# Patient Record
Sex: Female | Born: 1950
Health system: Southern US, Community
[De-identification: ages and names within clinical notes are randomized; demographics above are authoritative.]

## PROBLEM LIST (undated history)

## (undated) DIAGNOSIS — R519 Headache, unspecified: Secondary | ICD-10-CM

## (undated) DIAGNOSIS — H269 Unspecified cataract: Secondary | ICD-10-CM

## (undated) DIAGNOSIS — I10 Essential (primary) hypertension: Secondary | ICD-10-CM

## (undated) DIAGNOSIS — R51 Headache: Secondary | ICD-10-CM

## (undated) DIAGNOSIS — H409 Unspecified glaucoma: Secondary | ICD-10-CM

## (undated) DIAGNOSIS — C50919 Malignant neoplasm of unspecified site of unspecified female breast: Secondary | ICD-10-CM

## (undated) DIAGNOSIS — T7840XA Allergy, unspecified, initial encounter: Secondary | ICD-10-CM

## (undated) DIAGNOSIS — M1990 Unspecified osteoarthritis, unspecified site: Secondary | ICD-10-CM

## (undated) DIAGNOSIS — Z862 Personal history of diseases of the blood and blood-forming organs and certain disorders involving the immune mechanism: Secondary | ICD-10-CM

## (undated) DIAGNOSIS — D649 Anemia, unspecified: Secondary | ICD-10-CM

## (undated) DIAGNOSIS — J302 Other seasonal allergic rhinitis: Secondary | ICD-10-CM

## (undated) DIAGNOSIS — K219 Gastro-esophageal reflux disease without esophagitis: Secondary | ICD-10-CM

## (undated) DIAGNOSIS — E785 Hyperlipidemia, unspecified: Secondary | ICD-10-CM

## (undated) HISTORY — DX: Other seasonal allergic rhinitis: J30.2

## (undated) HISTORY — DX: Personal history of diseases of the blood and blood-forming organs and certain disorders involving the immune mechanism: Z86.2

## (undated) HISTORY — DX: Unspecified glaucoma: H40.9

## (undated) HISTORY — DX: Hyperlipidemia, unspecified: E78.5

## (undated) HISTORY — DX: Unspecified osteoarthritis, unspecified site: M19.90

## (undated) HISTORY — DX: Gastro-esophageal reflux disease without esophagitis: K21.9

## (undated) HISTORY — DX: Essential (primary) hypertension: I10

## (undated) HISTORY — DX: Allergy, unspecified, initial encounter: T78.40XA

## (undated) HISTORY — DX: Unspecified cataract: H26.9

## (undated) HISTORY — PX: BREAST BIOPSY: SHX20

---

## 2009-06-06 ENCOUNTER — Ambulatory Visit: Payer: Self-pay | Admitting: Obstetrics and Gynecology

## 2009-06-06 ENCOUNTER — Inpatient Hospital Stay (HOSPITAL_COMMUNITY): Admission: AD | Admit: 2009-06-06 | Discharge: 2009-06-06 | Payer: Self-pay | Admitting: Obstetrics & Gynecology

## 2011-01-13 LAB — URINALYSIS, ROUTINE W REFLEX MICROSCOPIC
Glucose, UA: NEGATIVE mg/dL
Hgb urine dipstick: NEGATIVE
Protein, ur: NEGATIVE mg/dL
Specific Gravity, Urine: 1.02 (ref 1.005–1.030)

## 2011-01-13 LAB — CBC
HCT: 40.1 % (ref 36.0–46.0)
MCV: 84.7 fL (ref 78.0–100.0)
RBC: 4.73 MIL/uL (ref 3.87–5.11)
WBC: 4.5 10*3/uL (ref 4.0–10.5)

## 2016-09-20 ENCOUNTER — Encounter: Payer: Self-pay | Admitting: Family Medicine

## 2016-09-20 ENCOUNTER — Ambulatory Visit: Payer: Self-pay | Attending: Family Medicine | Admitting: Family Medicine

## 2016-09-20 ENCOUNTER — Telehealth: Payer: Self-pay | Admitting: *Deleted

## 2016-09-20 VITALS — BP 121/82 | HR 65 | Temp 97.1°F | Resp 24 | Ht 65.0 in | Wt 184.0 lb

## 2016-09-20 DIAGNOSIS — J3089 Other allergic rhinitis: Secondary | ICD-10-CM | POA: Insufficient documentation

## 2016-09-20 DIAGNOSIS — Z131 Encounter for screening for diabetes mellitus: Secondary | ICD-10-CM | POA: Insufficient documentation

## 2016-09-20 DIAGNOSIS — Z23 Encounter for immunization: Secondary | ICD-10-CM | POA: Insufficient documentation

## 2016-09-20 DIAGNOSIS — Z13220 Encounter for screening for lipoid disorders: Secondary | ICD-10-CM | POA: Insufficient documentation

## 2016-09-20 DIAGNOSIS — K219 Gastro-esophageal reflux disease without esophagitis: Secondary | ICD-10-CM | POA: Insufficient documentation

## 2016-09-20 DIAGNOSIS — I10 Essential (primary) hypertension: Secondary | ICD-10-CM | POA: Insufficient documentation

## 2016-09-20 DIAGNOSIS — Z0000 Encounter for general adult medical examination without abnormal findings: Secondary | ICD-10-CM | POA: Insufficient documentation

## 2016-09-20 DIAGNOSIS — Z8249 Family history of ischemic heart disease and other diseases of the circulatory system: Secondary | ICD-10-CM | POA: Insufficient documentation

## 2016-09-20 LAB — GLUCOSE, POCT (MANUAL RESULT ENTRY): POC Glucose: 95 mg/dl (ref 70–99)

## 2016-09-20 LAB — POCT GLYCOSYLATED HEMOGLOBIN (HGB A1C): Hemoglobin A1C: 5.7

## 2016-09-20 MED ORDER — OMEPRAZOLE 20 MG PO CPDR: 20.0000 mg | DELAYED_RELEASE_CAPSULE | Freq: Every day | ORAL | 2 refills | Status: DC

## 2016-09-20 MED ORDER — MONTELUKAST SODIUM 10 MG PO TABS: 10.0000 mg | ORAL_TABLET | Freq: Every day | ORAL | 2 refills | Status: DC

## 2016-09-20 MED ORDER — AMLODIPINE BESYLATE 5 MG PO TABS: 5.0000 mg | ORAL_TABLET | Freq: Every day | ORAL | 2 refills | Status: DC

## 2016-09-20 MED FILL — OMEPRAZOLE DR 20 MG CAPSULE: 20 | 30 days supply | Qty: 30 | Fill #0

## 2016-09-20 MED FILL — MONTELUKAST SOD 10 MG TAB: 10 | 30 days supply | Qty: 30 | Fill #0

## 2016-09-20 MED FILL — AMLODIPINE BESYLATE 5 MG TA: 5 | 30 days supply | Qty: 30 | Fill #0

## 2016-09-20 NOTE — Progress Notes (Signed)
Subjective:   Chief Complaint  Patient presents with  . Gynecologic Exam  . Hypertension  . Headache   HPI Jill Adkins 65 y.o. female presents with multiple chief complaints she is also requesting medication refills.She reports a history of hypertension, GERD, hyperlipidemia, and allergic rhinitis.She has complaints of epigastric pain 5/10  and heartburn. She reports long term use of omeprazole. She denies any difficulty swallowing. She denies any CP, SOB, or swelling of the extremities. She was encouraged to schedule another appointment for a complete physical including a gynecologic exam.   Past Medical History:  Diagnosis Date  . GERD (gastroesophageal reflux disease)   . Hyperlipidemia   . Hypertension    History reviewed. No pertinent surgical history.  Family History  Problem Relation Age of Onset  . Hypertension Mother   . Hypertension Father    Social History   Social History  . Marital status: Single    Spouse name: N/A  . Number of children: N/A  . Years of education: N/A   Social History Main Topics  . Smoking status: Never Smoker  . Smokeless tobacco: Never Used  . Alcohol use No  . Drug use: No  . Sexual activity: Not on file    No outpatient prescriptions prior to visit.   No facility-administered medications prior to visit.    No Known Allergies  Review of Systems  HENT: Negative for sore throat.   Eyes: Negative for blurred vision.  Respiratory: Negative.   Cardiovascular: Negative.   Gastrointestinal: Positive for abdominal pain and heartburn. Negative for nausea and vomiting.  Neurological: Positive for headaches.     Objective:    Physical Exam  Constitutional: She is oriented to person, place, and time. She appears well-developed and well-nourished.  HENT:  Right Ear: External ear normal.  Left Ear: External ear normal.  Nose: Nose normal.  Mouth/Throat: Oropharynx is clear and moist.  Eyes: Conjunctivae are normal. Right eye  exhibits no discharge. Left eye exhibits no discharge. No scleral icterus.  Neck: Normal range of motion. No JVD present.  Cardiovascular: Normal rate, regular rhythm, normal heart sounds and intact distal pulses.   Pulmonary/Chest: Effort normal and breath sounds normal.  Abdominal: Soft. Bowel sounds are normal. She exhibits no distension and no mass. There is tenderness (epigastric tenderness with palpation).  Lymphadenopathy:    She has no cervical adenopathy.  Neurological: She is alert and oriented to person, place, and time.  Skin: Skin is warm and dry.  Psychiatric: She has a normal mood and affect. Her behavior is normal. Thought content normal.   BP 121/82   Pulse 65   Temp 97.1 F (36.2 C) (Oral)   Resp (!) 24   Ht 5\' 5"  (1.651 m)   Wt 184 lb (83.5 kg)   SpO2 97%   BMI 30.62 kg/m  Wt Readings from Last 3 Encounters:  09/20/16 184 lb (83.5 kg)   Immunization History  Administered Date(s) Administered  . Influenza,inj,Quad PF,36+ Mos 09/20/2016  . Tdap 09/20/2016   No results found for: TSH Lab Results  Component Value Date   WBC 4.5 06/06/2009   HGB 13.1 06/06/2009   HCT 40.1 06/06/2009   MCV 84.7 06/06/2009   PLT 239 06/06/2009   Lab Results  Component Value Date   HGBA1C 5.7 09/20/2016     Assessment & Plan:   Problem List Items Addressed This Visit    None    Visit Diagnoses    Gastroesophageal reflux disease, esophagitis presence  not specified    -  Primary   Relevant Medications   omeprazole (PRILOSEC) 20 MG capsule   Essential hypertension       Relevant Medications   amLODipine (NORVASC) 5 MG tablet   Screening for diabetes mellitus       Relevant Orders   POCT glucose (manual entry)   POCT glycosylated hemoglobin (Hb A1C)   Screening for hyperlipidemia       Relevant Orders   Lipid Panel    -Pt.has not fasted today order placed for future fasting lipid panel.   Healthcare maintenance       Relevant Orders   Ambulatory referral to  Gastroenterology   MM Digital Screening        -Come back in 2 weeks for complete physical exam with pap.    Chronic non-seasonal allergic rhinitis, unspecified trigger       Relevant Medications   montelukast (SINGULAIR) 10 MG tablet   Encounter for immunization       Relevant Orders   Flu Vaccine QUAD 36+ mos IM (Completed)     I have evaluated  Ms. Depaolis's omeprazole, montelukast, and amLODipine. I am also having her start on omeprazole, amLODipine, and montelukast.  Meds ordered this encounter  Medications  . DISCONTD: omeprazole (PRILOSEC) 20 MG capsule    Sig: Take 20 mg by mouth daily.  Marland Kitchen DISCONTD: montelukast (SINGULAIR) 10 MG tablet    Sig: Take 10 mg by mouth at bedtime.  Marland Kitchen DISCONTD: amLODipine (NORVASC) 5 MG tablet    Sig: Take 5 mg by mouth daily.  Marland Kitchen omeprazole (PRILOSEC) 20 MG capsule    Sig: Take 1 capsule (20 mg total) by mouth daily.    Dispense:  30 capsule    Refill:  2    Order Specific Question:   Supervising Provider    Answer:   Tresa Garter [id number]  . amLODipine (NORVASC) 5 MG tablet    Sig: Take 1 tablet (5 mg total) by mouth daily.    Dispense:  90 tablet    Refill:  2    Order Specific Question:   Supervising Provider    Answer:   Tresa Garter [id number]  . montelukast (SINGULAIR) 10 MG tablet    Sig: Take 1 tablet (10 mg total) by mouth at bedtime.    Dispense:  30 tablet    Refill:  2    Order Specific Question:   Supervising Provider    Answer:   Tresa Garter [id number]    Fredia Beets, FNP

## 2016-09-20 NOTE — Progress Notes (Signed)
  C/o Migraine started this morning. Stomach pain. Right thigh (spot) x 1 week. Refill: amlodipine, omeprazole. Check cholesterol, hx: hyperlipidemia; also DM screening.  Last meal at 5am this morning CBG: 95mg /dl; A1c: 5.7.  Fax request for [place]. Patient advised should expect to receive call from [place]; contact information provided to patient.  Priscille Heidelberg, RN, BSN

## 2016-09-20 NOTE — Patient Instructions (Addendum)
Food Choices for Gastroesophageal Reflux Disease, Adult When you have gastroesophageal reflux disease (GERD), the foods you eat and your eating habits are very important. Choosing the right foods can help ease the discomfort of GERD. What general guidelines do I need to follow?  Choose fruits, vegetables, whole grains, low-fat dairy products, and low-fat meat, fish, and poultry.  Limit fats such as oils, salad dressings, butter, nuts, and avocado.  Keep a food diary to identify foods that cause symptoms.  Avoid foods that cause reflux. These may be different for different people.  Eat frequent small meals instead of three large meals each day.  Eat your meals slowly, in a relaxed setting.  Limit fried foods.  Cook foods using methods other than frying.  Avoid drinking alcohol.  Avoid drinking large amounts of liquids with your meals.  Avoid bending over or lying down until 2-3 hours after eating. What foods are not recommended? The following are some foods and drinks that may worsen your symptoms: Vegetables  Tomatoes. Tomato juice. Tomato and spaghetti sauce. Chili peppers. Onion and garlic. Horseradish. Fruits  Oranges, grapefruit, and lemon (fruit and juice). Meats  High-fat meats, fish, and poultry. This includes hot dogs, ribs, ham, sausage, salami, and bacon. Dairy  Whole milk and chocolate milk. Sour cream. Cream. Butter. Ice cream. Cream cheese. Beverages  Coffee and tea, with or without caffeine. Carbonated beverages or energy drinks. Condiments  Hot sauce. Barbecue sauce. Sweets/Desserts  Chocolate and cocoa. Donuts. Peppermint and spearmint. Fats and Oils  High-fat foods, including [place] fries and potato chips. Other  Vinegar. Strong spices, such as black pepper, white pepper, red pepper, cayenne, curry powder, cloves, ginger, and chili powder. The items listed above may not be a complete list of foods and beverages to avoid. Contact your dietitian for more  information.  This information is not intended to replace advice given to you by your health care provider. Make sure you discuss any questions you have with your health care provider. Document Released: 09/24/2005 Document Revised: 03/01/2016 Document Reviewed: 07/29/2013 Elsevier Interactive Patient Education  2017 Elsevier Inc.   Hypertension Hypertension is another name for high blood pressure. High blood pressure forces your heart to work harder to pump blood. A blood pressure reading has two numbers, which includes a higher number over a lower number (example: 110/72). Follow these instructions at home:  Have your blood pressure rechecked by your doctor.  Only take medicine as told by your doctor. Follow the directions carefully. The medicine does not work as well if you skip doses. Skipping doses also puts you at risk for problems.  Do not smoke.  Monitor your blood pressure at home as told by your doctor. Contact a doctor if:  You think you are having a reaction to the medicine you are taking.  You have repeat headaches or feel dizzy.  You have puffiness (swelling) in your ankles.  You have trouble with your vision. Get help right away if:  You get a very bad headache and are confused.  You feel weak, numb, or faint.  You get chest or belly (abdominal) pain.  You throw up (vomit).  You cannot breathe very well. This information is not intended to replace advice given to you by your health care provider. Make sure you discuss any questions you have with your health care provider. Document Released: 03/12/2008 Document Revised: 03/01/2016 Document Reviewed: 07/17/2013 Elsevier Interactive Patient Education  2017 [employer].

## 2016-09-20 NOTE — Telephone Encounter (Signed)
Pt authorized daughter to call to ask about lab results for HgA1C today. Explained to pt concern for prediabetes as per provider, M.Hairston,NP. Informed to exercise 3-4 times/ week for 30 minutes and eat diet low in carbohydrates, simple sugars, limit juice, sodas, and increase vegetable intake. Also informed to return in the morning for FLP, 2 weeks for physical, and 3 months for f/u HgA1C. Pt verbalized understanding. She had question about referrals: Mammogram and Endoscopy. Explained the referral department would call them with the appointments. Carilyn Goodpasture, RN

## 2016-09-21 ENCOUNTER — Ambulatory Visit: Payer: Self-pay | Attending: Family Medicine

## 2016-09-21 DIAGNOSIS — Z13220 Encounter for screening for lipoid disorders: Secondary | ICD-10-CM | POA: Insufficient documentation

## 2016-09-21 LAB — LIPID PANEL
CHOL/HDL RATIO: 3.7 ratio (ref <5.0)
Cholesterol: 244 mg/dL — ABNORMAL HIGH (ref <200)
HDL: 66 mg/dL (ref >50)
LDL CALC: 162 mg/dL — AB (ref <100)
Triglycerides: 80 mg/dL (ref <150)
VLDL: 16 mg/dL (ref <30)

## 2016-09-21 NOTE — Progress Notes (Signed)
Patient here for lab visit only 

## 2016-09-24 ENCOUNTER — Other Ambulatory Visit: Payer: Self-pay | Admitting: Family Medicine

## 2016-09-24 DIAGNOSIS — E785 Hyperlipidemia, unspecified: Secondary | ICD-10-CM | POA: Insufficient documentation

## 2016-09-24 DIAGNOSIS — E782 Mixed hyperlipidemia: Secondary | ICD-10-CM

## 2016-09-24 LAB — HM PAP SMEAR: HM Pap smear: NORMAL

## 2016-09-24 MED ORDER — ROSUVASTATIN CALCIUM 10 MG PO TABS: 10.0000 mg | ORAL_TABLET | Freq: Every day | ORAL | 2 refills | Status: DC

## 2016-09-24 MED FILL — ROSUVASTATIN CALCIUM 10 MG: 10 | 30 days supply | Qty: 30 | Fill #0

## 2016-09-25 ENCOUNTER — Encounter (INDEPENDENT_AMBULATORY_CARE_PROVIDER_SITE_OTHER): Payer: Self-pay

## 2016-09-25 ENCOUNTER — Telehealth: Payer: Self-pay | Admitting: *Deleted

## 2016-09-25 ENCOUNTER — Telehealth: Payer: Self-pay

## 2016-09-25 NOTE — Telephone Encounter (Addendum)
Call placed to [facility], translation of call provided by rep "[place], [place]".  ___  RN advised patient per FNP Mandesia Hairston: Lipid panel showed elevated cholesterol and LDL levels. Elevated cholesterol and lipids can increase your risk of cardiovascular disease. I have started you on a medication called Crestor to help reduce these levels. -Come back in 8 weeks for lipid panel lab recheck.

## 2016-09-25 NOTE — Telephone Encounter (Signed)
Per patient, she stopped take Omeprazole because it makes her sick. She c/o fever, headache and abdominal pain.  In the past, patient took Omeprazole with Magnesium request to restart this medication. Please advise.

## 2016-09-25 NOTE — Telephone Encounter (Addendum)
-   Rn advised patient per FNP Mandesia Hairston: -HgbA1c is 5.7. A Hgb of 5.7 to 6.4 is considered pre-diabetes. Start eating a diet lower in sugar, starches, white bread, and white rice. Start exercising 3 to 5 times a week for at least 30 minutes. -Come back for repeat HgbA1c lab in 3 months.

## 2016-09-25 NOTE — Telephone Encounter (Signed)
Pt and daughter came into office for clarification of lab results. Explained lab results to patient while daughter translating. Verbalized understanding. Informed of importance of diet and exercise as related to hypercholesterol and prediabetes.pt and daughter verbalized understanding. Pt and daughter requested to have referral for colonoscopy to [facility] and mammogram set up at [facility]. She stated that she spoke to personnel at [facility] and they have an appointment before February. Preferred to have it next month.

## 2016-09-26 ENCOUNTER — Other Ambulatory Visit: Payer: Self-pay | Admitting: Family Medicine

## 2016-09-26 ENCOUNTER — Telehealth: Payer: Self-pay | Admitting: *Deleted

## 2016-09-26 MED ORDER — RANITIDINE HCL 150 MG PO TABS: 150.0000 mg | ORAL_TABLET | Freq: Two times a day (BID) | ORAL | 2 refills | Status: DC

## 2016-09-26 MED FILL — raNITIdine HCL 150 MG TABS: 150 | 15 days supply | Qty: 30 | Fill #0

## 2016-09-26 NOTE — Telephone Encounter (Signed)
The use of omeprazole is not associated with fever, headaches, or abdominal pain. If she is having these symptoms she can make an appointment. Considering her history of long-term use of omeprazole I have prescribe her ranitidine 150 mg by mouth twice a day.

## 2016-09-26 NOTE — Telephone Encounter (Signed)
Pt aware that medication is at the pharmacy. Spoke to patient daughter. Verbalized understanding, will pick up medication on tomorrow. Carilyn Goodpasture, RN

## 2016-09-27 NOTE — Telephone Encounter (Signed)
Sent Referral to [facility] Ph# [phone] #[phone] .They will contact the patient to schedule an appointment  ° °

## 2016-09-27 NOTE — Telephone Encounter (Signed)
Pt daughter aware

## 2016-10-04 ENCOUNTER — Telehealth: Payer: Self-pay | Admitting: Family Medicine

## 2016-10-04 ENCOUNTER — Other Ambulatory Visit: Payer: Self-pay | Admitting: Family Medicine

## 2016-10-04 ENCOUNTER — Telehealth: Payer: Self-pay

## 2016-10-04 ENCOUNTER — Ambulatory Visit: Payer: Self-pay | Attending: Family Medicine | Admitting: Family Medicine

## 2016-10-04 VITALS — BP 149/85 | HR 74 | Temp 97.6°F | Ht 65.0 in | Wt 182.4 lb

## 2016-10-04 DIAGNOSIS — K219 Gastro-esophageal reflux disease without esophagitis: Secondary | ICD-10-CM | POA: Insufficient documentation

## 2016-10-04 DIAGNOSIS — K5900 Constipation, unspecified: Secondary | ICD-10-CM | POA: Insufficient documentation

## 2016-10-04 DIAGNOSIS — I10 Essential (primary) hypertension: Secondary | ICD-10-CM

## 2016-10-04 DIAGNOSIS — Z01419 Encounter for gynecological examination (general) (routine) without abnormal findings: Secondary | ICD-10-CM

## 2016-10-04 DIAGNOSIS — Z79899 Other long term (current) drug therapy: Secondary | ICD-10-CM | POA: Insufficient documentation

## 2016-10-04 DIAGNOSIS — J3089 Other allergic rhinitis: Secondary | ICD-10-CM

## 2016-10-04 DIAGNOSIS — Z8249 Family history of ischemic heart disease and other diseases of the circulatory system: Secondary | ICD-10-CM | POA: Insufficient documentation

## 2016-10-04 DIAGNOSIS — E785 Hyperlipidemia, unspecified: Secondary | ICD-10-CM | POA: Insufficient documentation

## 2016-10-04 DIAGNOSIS — Z0000 Encounter for general adult medical examination without abnormal findings: Secondary | ICD-10-CM | POA: Insufficient documentation

## 2016-10-04 DIAGNOSIS — E782 Mixed hyperlipidemia: Secondary | ICD-10-CM

## 2016-10-04 LAB — HEPATIC FUNCTION PANEL
ALBUMIN: 4.4 g/dL (ref 3.6–5.1)
ALK PHOS: 72 U/L (ref 33–130)
ALT: 12 U/L (ref 6–29)
AST: 18 U/L (ref 10–35)
BILIRUBIN TOTAL: 0.6 mg/dL (ref 0.2–1.2)
Bilirubin, Direct: 0.1 mg/dL (ref <=0.2)
Indirect Bilirubin: 0.5 mg/dL (ref 0.2–1.2)
TOTAL PROTEIN: 7.6 g/dL (ref 6.1–8.1)

## 2016-10-04 LAB — CBC WITH DIFFERENTIAL/PLATELET
BASOS ABS: 40 {cells}/uL (ref 0–200)
Basophils Relative: 1 %
Eosinophils Absolute: 120 cells/uL (ref 15–500)
Eosinophils Relative: 3 %
HEMATOCRIT: 42.1 % (ref 35.0–45.0)
HEMOGLOBIN: 13.9 g/dL (ref 11.7–15.5)
LYMPHS ABS: 2560 {cells}/uL (ref 850–3900)
Lymphocytes Relative: 64 %
MCH: 26.8 pg — ABNORMAL LOW (ref 27.0–33.0)
MCHC: 33 g/dL (ref 32.0–36.0)
MCV: 81.1 fL (ref 80.0–100.0)
MONO ABS: 160 {cells}/uL — AB (ref 200–950)
MPV: 10.7 fL (ref 7.5–12.5)
Monocytes Relative: 4 %
NEUTROS PCT: 28 %
Neutro Abs: 1120 cells/uL — ABNORMAL LOW (ref 1500–7800)
Platelets: 349 10*3/uL (ref 140–400)
RBC: 5.19 MIL/uL — AB (ref 3.80–5.10)
RDW: 15 % (ref 11.0–15.0)
WBC: 4 10*3/uL (ref 3.8–10.8)

## 2016-10-04 LAB — BASIC METABOLIC PANEL
BUN: 12 mg/dL (ref 7–25)
CO2: 27 mmol/L (ref 20–31)
Calcium: 9.7 mg/dL (ref 8.6–10.4)
Chloride: 105 mmol/L (ref 98–110)
Creat: 0.74 mg/dL (ref 0.50–0.99)
GLUCOSE: 105 mg/dL — AB (ref 65–99)
POTASSIUM: 4 mmol/L (ref 3.5–5.3)
SODIUM: 142 mmol/L (ref 135–146)

## 2016-10-04 LAB — TSH: TSH: 0.81 mIU/L

## 2016-10-04 MED ORDER — ROSUVASTATIN CALCIUM 10 MG PO TABS: 10.0000 mg | ORAL_TABLET | Freq: Every day | ORAL | 0 refills | Status: DC

## 2016-10-04 MED ORDER — DOCUSATE SODIUM 100 MG PO CAPS: 100.0000 mg | ORAL_CAPSULE | Freq: Two times a day (BID) | ORAL | 0 refills | Status: DC

## 2016-10-04 MED ORDER — RANITIDINE HCL 150 MG PO TABS: 150.0000 mg | ORAL_TABLET | Freq: Two times a day (BID) | ORAL | 0 refills | Status: DC

## 2016-10-04 MED ORDER — MONTELUKAST SODIUM 10 MG PO TABS: 10.0000 mg | ORAL_TABLET | Freq: Every day | ORAL | 0 refills | Status: DC

## 2016-10-04 MED ORDER — DOCUSATE SODIUM 100 MG PO CAPS: 100.0000 mg | ORAL_CAPSULE | Freq: Two times a day (BID) | ORAL | 0 refills | Status: AC

## 2016-10-04 MED ORDER — ACETAMINOPHEN 500 MG PO TABS: 1000.0000 mg | ORAL_TABLET | Freq: Three times a day (TID) | ORAL | 0 refills | Status: DC | PRN

## 2016-10-04 MED ORDER — AMLODIPINE BESYLATE 5 MG PO TABS: 5.0000 mg | ORAL_TABLET | Freq: Every day | ORAL | 0 refills | Status: DC

## 2016-10-04 NOTE — Telephone Encounter (Signed)
Called and left pt's daughter a message informing her that patient's prescriptions have been refilled for the next 3 months.

## 2016-10-04 NOTE — Telephone Encounter (Signed)
Call returned to patient daughter; Voicemail identified patient; RN advised symptom of sore tongue may require evaluation by a provider.  Can be caused by vitamin deficiency or virus.  Would not be a symptom of GERD unless patient has regurgitation of stomach acid that touches the tongue which over time can be an irritant.  Encouraged to callback to [facility] if any additional questions or concerns.  Priscille Heidelberg, RN, BSN

## 2016-10-04 NOTE — Telephone Encounter (Signed)
Patient was seen in the office today and forgot to mention to PCP that she will be leaving the country on 10[place]09[place]16 for three months and therefore need a three moth supply on all her medication. Please send it to our pharmacy.  Thank you.

## 2016-10-04 NOTE — Telephone Encounter (Signed)
Refills for 3 months supply of her medications have been sent.

## 2016-10-04 NOTE — Telephone Encounter (Signed)
Call returned to patient daughter; Voicemail identified patient; RN advised symptom of sore tongue may require evaluation by a provider.  Can be caused by vitamin deficiency or virus.  Would not be a symptom of GERD unless patient has regurgitation of stomach acid that touches the tongue which over time can be an irritant. Recommended otc: Orajel topical pain relief, or mouth rinse  Encouraged to callback to [facility] if any additional questions or concerns.  Priscille Heidelberg, RN, BSN

## 2016-10-04 NOTE — Telephone Encounter (Signed)
Pts daughter calling with concerns Daughter states that pt was seen today but was unable to communicate that pts tongue has a sore which is causing trouble to eat Daughter is requesting to speak to nurse to get advice on what pt can do/take to relieve this pain Daughter suggested the sore could be related to pts GERD

## 2016-10-04 NOTE — Progress Notes (Signed)
Pt is here today for a physical with PAP.

## 2016-10-04 NOTE — Progress Notes (Signed)
Subjective:   Patient ID: Jill Adkins, female    DOB: 1951-06-21, 65 y.o.   MRN: [id number]  Chief Complaint  Patient presents with  . Annual Exam   HPI Jill Adkins 65 y.o. female comes to office for annual physical examination and pap exam. She denies any CP, SOB, or swelling of the extremities. She denies any dizziness or lightheadedness. She does report noticing a "bump" on her tongue yesterday. She denies any difficulty eating, drinking or swallowing.She does report improvement of GERD symptoms. She denies any changes to bladder habits. She does report constipation for 1 week.   Past Medical History:  Diagnosis Date  . GERD (gastroesophageal reflux disease)   . Hyperlipidemia   . Hypertension    Family History  Problem Relation Age of Onset  . Hypertension Mother   . Hypertension Father    Social History   Social History  . Marital status: Single    Spouse name: N/A  . Number of children: N/A  . Years of education: N/A   Social History Main Topics  . Smoking status: Never Smoker  . Smokeless tobacco: Never Used  . Alcohol use No  . Drug use: No  . Sexual activity: Not on file   Outpatient Medications Prior to Visit  Medication Sig Dispense Refill  . amLODipine (NORVASC) 5 MG tablet Take 1 tablet (5 mg total) by mouth daily. 90 tablet 2  . montelukast (SINGULAIR) 10 MG tablet Take 1 tablet (10 mg total) by mouth at bedtime. 30 tablet 2  . ranitidine (ZANTAC) 150 MG tablet Take 1 tablet (150 mg total) by mouth 2 (two) times daily. 30 tablet 2  . rosuvastatin (CRESTOR) 10 MG tablet Take 1 tablet (10 mg total) by mouth daily. 30 tablet 2   No facility-administered medications prior to visit.    No Known Allergies  Review of Systems  Constitutional: Negative.   Respiratory: Negative.   Cardiovascular: Negative.   Gastrointestinal: Positive for constipation.  Genitourinary: Negative.   Musculoskeletal: Negative.   Skin: Negative.   Neurological: Negative.     Psychiatric/Behavioral: Negative.      Objective:    Physical Exam  Constitutional: She is oriented to person, place, and time. She appears well-developed and well-nourished.  HENT:  Mouth/Throat: Uvula is midline, oropharynx is clear and moist and mucous membranes are normal. No oral lesions. No lacerations. No oropharyngeal exudate or posterior oropharyngeal edema.  Enlarged tastebuds noted. No lesions. No oral candidasis present.   Eyes: Conjunctivae and EOM are normal. Pupils are equal, round, and reactive to light. Right eye exhibits no discharge. Left eye exhibits no discharge.  Neck: Normal range of motion. Neck supple. No JVD present. No thyromegaly present.  Cardiovascular: Normal rate, regular rhythm, normal heart sounds and intact distal pulses.   Pulmonary/Chest: Effort normal and breath sounds normal. No respiratory distress. She has no wheezes.  Abdominal: Soft. Bowel sounds are normal. She exhibits no distension and no mass. There is no tenderness.  Musculoskeletal: Normal range of motion.  Lymphadenopathy:    She has no cervical adenopathy.  Neurological: She is alert and oriented to person, place, and time.  Skin: Skin is warm and dry.  Psychiatric: She has a normal mood and affect. Her behavior is normal. Thought content normal.   BP (!) 149/85 (BP Location: Left Arm, Patient Position: Sitting, Cuff Size: Small)   Pulse 74   Temp 97.6 F (36.4 C) (Oral)   Ht 5\' 5"  (1.651 m)   Wt 182  lb 6.4 oz (82.7 kg)   SpO2 97%   BMI 30.35 kg/m  Wt Readings from Last 3 Encounters:  10/04/16 182 lb 6.4 oz (82.7 kg)  09/20/16 184 lb (83.5 kg)    Immunization History  Administered Date(s) Administered  . Influenza,inj,Quad PF,36+ Mos 09/20/2016  . Tdap 09/20/2016   No results found for: TSH Lab Results  Component Value Date   WBC 4.0 10/04/2016   HGB 13.9 10/04/2016   HCT 42.1 10/04/2016   MCV 81.1 10/04/2016   PLT 349 10/04/2016   Lab Results  Component Value  Date   NA 142 10/04/2016   K 4.0 10/04/2016   CO2 27 10/04/2016   GLUCOSE 105 (H) 10/04/2016   BUN 12 10/04/2016   CREATININE 0.74 10/04/2016   BILITOT 0.6 10/04/2016   ALKPHOS 72 10/04/2016   AST 18 10/04/2016   ALT 12 10/04/2016   PROT 7.6 10/04/2016   ALBUMIN 4.4 10/04/2016   CALCIUM 9.7 10/04/2016   Lab Results  Component Value Date   CHOL 244 (H) 09/21/2016   Lab Results  Component Value Date   HDL 66 09/21/2016   Lab Results  Component Value Date   LDLCALC 162 (H) 09/21/2016   Lab Results  Component Value Date   TRIG 80 09/21/2016   Lab Results  Component Value Date   CHOLHDL 3.7 09/21/2016   Lab Results  Component Value Date   HGBA1C 5.7 09/20/2016     Assessment & Plan:   Problem List Items Addressed This Visit    None    Visit Diagnoses    Annual physical exam    -  Primary   Relevant Orders   Basic metabolic panel (Completed)   CBC with Differential (Completed)   Vitamin D, 25-hydroxy (Completed)   Hepatitis C Antibody (Completed)   TSH (Completed)   HIV antibody (with reflex) (Completed)   Hepatic Function Panel (Completed)   Encounter for routine gynecological examination with Papanicolaou smear of cervix       Relevant Orders   Cervicovaginal ancillary only (Completed)     Meds ordered this encounter  Medications  . acetaminophen (TYLENOL) 500 MG tablet    Sig: Take 2 tablets (1,000 mg total) by mouth every 8 (eight) hours as needed for headache.    Dispense:  60 tablet    Refill:  0    Order Specific Question:   Supervising Provider    Answer:   Tresa Garter [id number]  .  docusate sodium (COLACE) 100 MG capsule    Sig: Take 1 capsule (100 mg total) by mouth 2 (two) times daily.    Dispense:  60 capsule    Refill:  0    Order Specific Question:   Supervising Provider    Answer:   Tresa Garter [id number]     Fredia Beets, FNP   `

## 2016-10-05 ENCOUNTER — Encounter: Payer: Self-pay | Admitting: Family Medicine

## 2016-10-05 ENCOUNTER — Ambulatory Visit: Payer: Self-pay | Attending: Internal Medicine

## 2016-10-05 LAB — CERVICOVAGINAL ANCILLARY ONLY
HPV (WINDOPATH): NOT DETECTED
WET PREP (BD AFFIRM): NEGATIVE

## 2016-10-05 LAB — HIV ANTIBODY (ROUTINE TESTING W REFLEX): HIV 1&2 Ab, 4th Generation: NONREACTIVE

## 2016-10-05 LAB — VITAMIN D 25 HYDROXY (VIT D DEFICIENCY, FRACTURES): Vit D, 25-Hydroxy: 34 ng/mL (ref 30–100)

## 2016-10-05 LAB — HEPATITIS C ANTIBODY: HCV AB: NEGATIVE

## 2016-10-08 HISTORY — PX: BREAST LUMPECTOMY: SHX2

## 2016-10-09 MED FILL — MONTELUKAST SOD 10 MG TAB: 10 | 90 days supply | Qty: 90 | Fill #0

## 2016-10-09 MED FILL — raNITIdine HCL 150 MG TABS: 150 | 90 days supply | Qty: 180 | Fill #0

## 2016-10-11 ENCOUNTER — Encounter: Payer: Self-pay | Admitting: Family Medicine

## 2016-10-11 ENCOUNTER — Other Ambulatory Visit: Payer: Self-pay | Admitting: Family Medicine

## 2016-10-11 DIAGNOSIS — N6310 Unspecified lump in the right breast, unspecified quadrant: Secondary | ICD-10-CM

## 2016-10-11 MED FILL — ROSUVASTATIN CALCIUM 10 MG: 10 | 90 days supply | Qty: 90 | Fill #0

## 2016-10-11 MED FILL — AMLODIPINE BESYLATE 5 MG TA: 5 | 90 days supply | Qty: 90 | Fill #0

## 2016-10-11 NOTE — Telephone Encounter (Signed)
Patient presented to the office for lab results from 10/04/16 visit. Patient aware of all labs being unremarkable and no changes to therapy being needed at this time. Patient expressed her understanding and had no further questions at this time.

## 2016-10-12 ENCOUNTER — Telehealth: Payer: Self-pay | Admitting: Family Medicine

## 2016-10-12 ENCOUNTER — Other Ambulatory Visit: Payer: Self-pay | Admitting: Family Medicine

## 2016-10-12 DIAGNOSIS — N6310 Unspecified lump in the right breast, unspecified quadrant: Secondary | ICD-10-CM

## 2016-10-12 DIAGNOSIS — I10 Essential (primary) hypertension: Secondary | ICD-10-CM

## 2016-10-12 NOTE — Telephone Encounter (Signed)
Patient's daughter, [name], came to the office to speak with you regarding the referral that was placed for [facility] for her mother. [name] stated that Hilda Blades from [facility] didn't receive the referral. Please follow up. Informed patient that referral coordinator will be here on Tuesday 1/9.  Thank you.

## 2016-10-16 ENCOUNTER — Encounter (HOSPITAL_COMMUNITY): Payer: Self-pay | Admitting: *Deleted

## 2016-10-16 ENCOUNTER — Ambulatory Visit (HOSPITAL_COMMUNITY)
Admission: RE | Admit: 2016-10-16 | Discharge: 2016-10-16 | Disposition: A | Discharge status: Home / Self Care | Payer: Self-pay | Source: Ambulatory Visit | Attending: Obstetrics and Gynecology | Admitting: Obstetrics and Gynecology

## 2016-10-16 VITALS — BP 120/84 | Temp 98.7°F | Ht 62.0 in | Wt 182.2 lb

## 2016-10-16 DIAGNOSIS — Z1239 Encounter for other screening for malignant neoplasm of breast: Secondary | ICD-10-CM

## 2016-10-16 DIAGNOSIS — N644 Mastodynia: Secondary | ICD-10-CM

## 2016-10-16 NOTE — Telephone Encounter (Signed)
Referral sent  (3 time ) to [facility] attention  Hassan Rowan  Thank You

## 2016-10-16 NOTE — Progress Notes (Signed)
Patient referred to [facility] by [facility] for a right breast biopsy. Screening mammogram completed 10/05/2016 recommending additional imaging of right breast. Right breast diagnostic completed 10/11/2016 showing bi-rads 4. Patient complained of right breast pain since mammogram was completed.  Pap Smear: Pap smear not completed today. Last Pap smear was 10/04/2016 at [place] and normal with negative HPV. Per patient has no history of an abnormal Pap smear. Last Pap smear result is in EPIC.  Physical exam: Breasts Breasts symmetrical. No skin abnormalities bilateral breasts. No nipple retraction bilateral breasts. No nipple discharge bilateral breasts. No lymphadenopathy. No lumps palpated bilateral breasts. Complaints of right inner and outer breast tenderness on exam. Referred patient to [facility] for a right breast biopsy per recommendation. Appointment scheduled for Wednesday, October 16, 2016 at 0830.        Pelvic/Bimanual No Pap smear completed today since last Pap smear and HPV typing was completed 10/04/2016. Pap smear not indicated per BCCCP guidelines.   Smoking History: Patient has never smoked.  Patient Navigation: Patient education provided. Access to services provided for patient through [facility] program. [place] interpreter provided.  Colorectal Cancer Screening: Per patient has never had a colonoscopy completed. No complaints today.  Used [place] interpreter Sheryle Spray Sow from [employer]. Interpreter is also patient's daughter. Waiver signed and will be scanned into EPIC.

## 2016-10-16 NOTE — Patient Instructions (Signed)
Explained breast self awarenss to Jill Adkins. Patient did not need a Pap smear today due to last Pap smear and HPV typing was 10/04/2016. Let her know [facility]CP will cover Pap smears and HPV typing every 5 years unless has a history of abnormal Pap smears. Referred patient to [facility] for a right breast biopsy per recommendation. Appointment scheduled for Wednesday, October 16, 2016 at 0830. Jill Adkins verbalized understanding.  Jill Adkins, Arvil Ch[place], RN 11:06 AM

## 2016-10-17 ENCOUNTER — Encounter: Payer: Self-pay | Admitting: Family Medicine

## 2016-10-17 ENCOUNTER — Other Ambulatory Visit: Payer: Self-pay | Admitting: Radiology

## 2016-10-17 ENCOUNTER — Encounter (HOSPITAL_COMMUNITY): Payer: Self-pay | Admitting: *Deleted

## 2016-10-29 ENCOUNTER — Other Ambulatory Visit: Payer: Self-pay | Admitting: Radiology

## 2016-10-31 ENCOUNTER — Encounter (HOSPITAL_COMMUNITY): Payer: Self-pay | Admitting: *Deleted

## 2016-10-31 ENCOUNTER — Other Ambulatory Visit: Payer: Self-pay | Admitting: Surgery

## 2016-11-01 ENCOUNTER — Other Ambulatory Visit: Payer: Self-pay | Admitting: Gastroenterology

## 2016-11-01 ENCOUNTER — Other Ambulatory Visit: Payer: Self-pay | Admitting: Surgery

## 2016-11-01 DIAGNOSIS — D0511 Intraductal carcinoma in situ of right breast: Secondary | ICD-10-CM

## 2016-11-01 MED FILL — TRILYTE WITH FLAVOR PACKETS: 420 | 1 days supply | Qty: 4000 | Fill #0

## 2016-11-02 ENCOUNTER — Encounter: Payer: Self-pay | Admitting: Radiation Oncology

## 2016-11-03 ENCOUNTER — Ambulatory Visit
Admission: RE | Admit: 2016-11-03 | Discharge: 2016-11-03 | Disposition: A | Discharge status: Home / Self Care | Payer: No Typology Code available for payment source | Source: Ambulatory Visit | Attending: Surgery | Admitting: Surgery

## 2016-11-03 DIAGNOSIS — D0511 Intraductal carcinoma in situ of right breast: Secondary | ICD-10-CM

## 2016-11-03 MED ORDER — GADOBENATE DIMEGLUMINE 529 MG/ML IV SOLN
17.0000 mL | Freq: Once | INTRAVENOUS | Status: AC | PRN
  Administered 2016-11-03: 17 mL via INTRAVENOUS

## 2016-11-05 ENCOUNTER — Ambulatory Visit
Admission: RE | Admit: 2016-11-05 | Discharge: 2016-11-05 | Disposition: A | Discharge status: Home / Self Care | Payer: No Typology Code available for payment source | Source: Ambulatory Visit | Attending: Radiation Oncology | Admitting: Radiation Oncology

## 2016-11-05 ENCOUNTER — Encounter: Payer: Self-pay | Admitting: Radiation Oncology

## 2016-11-05 ENCOUNTER — Other Ambulatory Visit: Payer: Self-pay | Admitting: Oncology

## 2016-11-05 ENCOUNTER — Telehealth: Payer: Self-pay | Admitting: Oncology

## 2016-11-05 VITALS — BP 139/71 | HR 71 | Temp 98.8°F | Resp 16 | Ht 62.0 in | Wt 179.2 lb

## 2016-11-05 DIAGNOSIS — Z8249 Family history of ischemic heart disease and other diseases of the circulatory system: Secondary | ICD-10-CM | POA: Insufficient documentation

## 2016-11-05 DIAGNOSIS — Z170 Estrogen receptor positive status [ER+]: Principal | ICD-10-CM

## 2016-11-05 DIAGNOSIS — C50311 Malignant neoplasm of lower-inner quadrant of right female breast: Secondary | ICD-10-CM

## 2016-11-05 DIAGNOSIS — C50212 Malignant neoplasm of upper-inner quadrant of left female breast: Secondary | ICD-10-CM

## 2016-11-05 DIAGNOSIS — I10 Essential (primary) hypertension: Secondary | ICD-10-CM | POA: Insufficient documentation

## 2016-11-05 DIAGNOSIS — K219 Gastro-esophageal reflux disease without esophagitis: Secondary | ICD-10-CM | POA: Insufficient documentation

## 2016-11-05 DIAGNOSIS — D0511 Intraductal carcinoma in situ of right breast: Secondary | ICD-10-CM | POA: Insufficient documentation

## 2016-11-05 DIAGNOSIS — C50411 Malignant neoplasm of upper-outer quadrant of right female breast: Secondary | ICD-10-CM

## 2016-11-05 DIAGNOSIS — E785 Hyperlipidemia, unspecified: Secondary | ICD-10-CM | POA: Insufficient documentation

## 2016-11-05 HISTORY — DX: Malignant neoplasm of unspecified site of unspecified female breast: C50.919

## 2016-11-05 NOTE — Telephone Encounter (Signed)
Error

## 2016-11-05 NOTE — Progress Notes (Addendum)
Location of Breast Cancer:  Histology per Pathology Report:Diagnosis 10/29/2016 Breast, right, needle core biopsy - DUCTAL CARCINOMA IN SITU WITH CALCIFICATIONS, INTERMEDIATE. - SEE MICROSCOPIC DESCRIPTION Diagnosis 1/10/218 Breast, right, needle core biopsy - DUCTAL CARCINOMA IN SITU WITH CALCIFICATIONS.  Diagnosis 10/17/2016 Breast, right, needle core biopsy - FIBROADENOMA. - THERE IS NO EVIDENCE OF MALIGNANCY  Receptor Status: ER(100%+), PR (95%+), Her2-neu (), Ki-()  Did patient present with symptoms (if so, please note symptoms) or was this found on screening mammography?: screening mammogram  Past/Anticipated interventions by surgeon, if any: Dr. [name], md, nothing scheduled as yet  Past/Anticipated interventions by medical oncology, if any: Chemotherapy : Dr. Jana Hakim  New appt 11/08/2016  Lymphedema issues, if any:  No  Pain issues, if any:  NO  SAFETY ISSUES: NO  Prior radiation? NO  Pacemaker/ICD?  NO  Possible current pregnancy? NO  Is the patient on methotrexate? NO  Current Complaints / other details:  G4P3,  Miscarriage, menses age 42, age 82st child age 81,speaks [place] from [place], daughter [name] interpreting today BP 139/71 (BP Location: Left Arm, Patient Position: Sitting, Cuff Size: Normal)   Pulse 71   Temp 98.8 F (37.1 C) (Oral)   Resp 16   Ht _0  (1.575 m)   Wt 179 lb 3.2 oz (81.3 kg)   BMI 32.78 kg/m   Wt Readings from Last 3 Encounters:  11/05/16 179 lb 3.2 oz (81.3 kg)  10/16/16 182 lb 3.2 oz (82.6 kg)  10/04/16 182 lb 6.4 oz (82.7 kg)      Rebecca Eaton, RN 11/05/2016,9:54 AM

## 2016-11-05 NOTE — Progress Notes (Signed)
Radiation Oncology         [phone] ________________________________  Name: Jill Adkins MRN: [id number]  Date: 11/05/2016  DOB: [birth year]  QM:[id number] Hairston, FNP  Curt Bears, MD     REFERRING PHYSICIAN: Curt Bears, MD   DIAGNOSIS: There were no encounter diagnoses.  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   HISTORY OF PRESENT ILLNESS: Jill Adkins is a 66 y.o. female who initially presented for screening mammogram on 10/05/16, which showed abnormality in the right breast. She was referred for right breast diagnostic on 10/11/16, with 3 subsequent biopsies revealing ductal carcinoma in situ with calcifications in the lower inner and lower outer quadrants of the right breast, with fibroadenoma noted at the 12 o'clock position. She met with Dr. Ninfa Linden and had an MRI of bilateral breasts on 11/03/16 revealing three lesions corresponding to the biopsy sites, with two large hematomas in the lower quadrants concsistent with recent biopsy. She also had linear enhancement of the left lower outer quadrant approximately 12 mm. No abnormal nodes were noted. The patient is accompanied by her daughter Jill Adkins, who the patient has elected to interpret on her behalf. She reports the patient is preparing to leave for [place] in 1 week, and will be gone for several months. She has plans to meet with Dr. Jana Hakim on Thursday to discuss hormone blockade. She comes today to discuss the role of radiotherapy.  PREVIOUS RADIATION THERAPY: No   PAST MEDICAL HISTORY:  Past Medical History:  Diagnosis Date  . Breast cancer ([place])    right breast  . GERD (gastroesophageal reflux disease)   . Hyperlipidemia   . Hypertension        PAST SURGICAL HISTORY:No past surgical history on file.   FAMILY HISTORY:  Family History  Problem Relation Age of Onset  . Hypertension Father      SOCIAL HISTORY:  reports that she has never smoked. She has never used  smokeless tobacco. She reports that she does not drink alcohol or use drugs. The patient is from [place] and speaks [place]. She has three children.   ALLERGIES: Patient has no known allergies.   MEDICATIONS:  Current Outpatient Prescriptions  Medication Sig Dispense Refill  . acetaminophen (TYLENOL) 500 MG tablet Take 2 tablets (1,000 mg total) by mouth every 8 (eight) hours as needed for headache. 60 tablet 0  . amLODipine (NORVASC) 5 MG tablet Take 1 tablet (5 mg total) by mouth daily. 90 tablet 0  . docusate sodium (COLACE) 100 MG capsule Take 1 capsule (100 mg total) by mouth 2 (two) times daily. (Patient taking differently: Take 100 mg by mouth daily as needed. ) 180 capsule 0  . montelukast (SINGULAIR) 10 MG tablet Take 1 tablet (10 mg total) by mouth at bedtime. 90 tablet 0  . ranitidine (ZANTAC) 150 MG tablet Take 1 tablet (150 mg total) by mouth 2 (two) times daily. 180 tablet 0  . rosuvastatin (CRESTOR) 10 MG tablet Take 1 tablet (10 mg total) by mouth daily. 90 tablet 0   No current facility-administered medications for this encounter.      REVIEW OF SYSTEMS: On review of systems, the patient reports that she is doing well overall. She denies any chest pain, shortness of breath, cough, fevers, chills, night sweats, unintended weight changes. She denies any bowel or bladder disturbances, and denies abdominal pain, nausea or vomiting. She denies any new musculoskeletal or joint aches or pains. She denies any lymphedema issues. A complete review  of systems is obtained and is otherwise negative.    PHYSICAL EXAM:  Wt Readings from Last 3 Encounters:  11/05/16 179 lb 3.2 oz (81.3 kg)  10/16/16 182 lb 3.2 oz (82.6 kg)  10/04/16 182 lb 6.4 oz (82.7 kg)   Temp Readings from Last 3 Encounters:  11/05/16 98.8 F (37.1 C) (Oral)  10/16/16 98.7 F (37.1 C) (Oral)  10/04/16 97.6 F (36.4 C) (Oral)   BP Readings from Last 3 Encounters:  11/05/16 139/71  10/16/16 120/84  10/04/16  (!) 149/85   Pulse Readings from Last 3 Encounters:  11/05/16 71  10/04/16 74  09/20/16 65   In general this is a well appearing African  female in no acute distress. She is alert and oriented x4 and appropriate throughout the examination. Cardiovascular exam reveals a regular rate and rhythm, no clicks rubs or murmurs are auscultated. Chest is clear to auscultation bilaterally. Lymphatic assessment is performed and does not reveal any adenopathy in the cervical, supraclavicular, axillary, or inguinal chains. Abdomen has active bowel sounds in all quadrants and is intact. The abdomen is soft, non tender, non distended. Lower extremities are negative for pretibial pitting edema, deep calf tenderness, cyanosis or clubbing. Examination of the breast reveals hematoma affecting lower quadrants of the breast on the right, no palpable tumor is noted per se though this is limited. No nipple discharge or bleeding is identified. Bruising consistent with prior biopsy was noted. The left breast does not have any palpable abnormalities   ECOG =0  0 - Asymptomatic (Fully active, able to carry on all predisease activities without restriction)  1 - Symptomatic but completely ambulatory (Restricted in physically strenuous activity but ambulatory and able to carry out work of a light or sedentary nature. For example, light housework, office work)  2 - Symptomatic, <50% in bed during the day (Ambulatory and capable of all self care but unable to carry out any work activities. Up and about more than 50% of waking hours)  3 - Symptomatic, >50% in bed, but not bedbound (Capable of only limited self-care, confined to bed or chair 50% or more of waking hours)  4 - Bedbound (Completely disabled. Cannot carry on any self-care. Totally confined to bed or chair)  5 - Death   Eustace Pen MM, Creech RH, Tormey DC, et al. [phone]). "Toxicity and response criteria of the [facility]". [place] Oncol. 5  (6): 649-55    LABORATORY DATA:  Lab Results  Component Value Date   WBC 4.0 10/04/2016   HGB 13.9 10/04/2016   HCT 42.1 10/04/2016   MCV 81.1 10/04/2016   PLT 349 10/04/2016   Lab Results  Component Value Date   NA 142 10/04/2016   K 4.0 10/04/2016   CL 105 10/04/2016   CO2 27 10/04/2016   Lab Results  Component Value Date   ALT 12 10/04/2016   AST 18 10/04/2016   ALKPHOS 72 10/04/2016   BILITOT 0.6 10/04/2016      RADIOGRAPHY: Mr Breast Bilateral W Wo Contrast  Result Date: 11/05/2016 CLINICAL DATA:  The patient has had 3 biopsies in the right breast. The mass at 12 o'clock is a fibroadenoma. The calcifications in the lower inner and lower outer quadrants represent DCIS. LABS:  GFR 96 EXAM: BILATERAL BREAST MRI WITH AND WITHOUT CONTRAST TECHNIQUE: Multiplanar, multisequence MR images of both breasts were obtained prior to and following the intravenous administration of 17 ml of MultiHance. THREE-DIMENSIONAL MR IMAGE RENDERING ON INDEPENDENT  WORKSTATION: Three-dimensional MR images were rendered by post-processing of the original MR data on an independent workstation. The three-dimensional MR images were interpreted, and findings are reported in the following complete MRI report for this study. Three dimensional images were evaluated at the independent Dyna[employer]ad workstation COMPARISON:  Previous exam(s). FINDINGS: Breast composition: b. Scattered fibroglandular tissue. Background parenchymal enhancement: Moderate. Right breast: There is a mass superiorly in the right breast which correlates with the patient's known fibroadenoma. There is an immediately adjacent biopsy clip. Two hematomas are seen in the inferior right breast, 1 in the inner quadrant and 1 in the outer quadrant. These represent sites of known DCIS. There is significant surrounding enhancement in the inferior half of the right breast. At least some of this enhancement is postprocedural. Left breast: There is linear  enhancement in the anterior aspect of the lower outer left breast, best appreciated on MIP imaging. The level of enhancement in this region is higher than elsewhere in the left breast. This enhancement spans approximately 12 mm with persistent kinetics, best seen on series 12, image 84. Lymph nodes: No abnormal appearing lymph nodes. Ancillary findings:  None. IMPRESSION: 1. Two moderate to large hematomas in the inferior right breast correlate with the known sites of DCIS. The biopsy clips on mammography are 6.5 cm apart. Much of the enhancement may be postprocedural, especially given the large hematomas. It would be difficult to differentiate between post biopsy enhancement and remaining DCIS. However, based on the multi centric disease, 6.5 cm apart, the patient is likely a mastectomy candidate. 2. There is linear enhancement in the anterior aspect of the lower outer left breast, best seen on MIP imaging and series 12, image 84 which demonstrates persistent enhancement kinetics. Given the appearance of this linear non mass enhancement and the fact that the patient has known DCIS on the right, this is moderately suspicious for DCIS. RECOMMENDATION: Recommend MRI guided biopsy of the linear enhancement in the lower outer left breast. BI-RADS CATEGORY  4: Suspicious. Electronically Signed   By: Dorise Bullion III M.D   On: 11/05/2016 11:00       IMPRESSION/PLAN: 1.  ER/PR positive DCIS of the right breast. We met with the patient and her daughter today and Dr. Lisbeth Renshaw discussed the natural history of breast cancer and general treatment, highlighting the role of radiotherapy in the management.  We discussed the available radiation techniques, and focused on the details of logistics and delivery.  We reviewed the anticipated acute and late sequelae associated with radiation in this setting.  She will  most likely a good candidate for 4 weeks of radiation therapy.  Because of the patient's planned trip to [place],  treatment will be postponed until after her return and subsequent lumpectomy. She will discuss estrogen blockade with Dr. Jana Hakim in the neoadjuvant setting when she sees him Thursday. Provided she proceeds with lumpectomy, as she desires, we will see her back 2-3 weeks postoperatively.  2. Left breast anomaly seen on MRI. As above, has been recommended the patient consider biopsy of this site. I have asked the patient if she would consider having this done prior to leaving the country, however she is not interested in doing this until she returns home after her trip which will be several months from now. I will communicate this is well with Dr. Jana Hakim and Dr. Ninfa Linden.  The above documentation reflects my direct findings during this shared patient visit. Please see the separate note by Dr. Lisbeth Renshaw on this date  for the remainder of the patient's plan of care.    Carola Rhine, PAC  This document serves as a record of services personally performed by Kyung Rudd, MD and Shona Simpson, PA-C. It was created on their behalf by Maryla Morrow, a trained medical scribe. The creation of this record is based on the scribe's personal observations and the provider's statements to them. This document has been checked and approved by the attending provider.

## 2016-11-06 ENCOUNTER — Telehealth: Payer: Self-pay | Admitting: Oncology

## 2016-11-06 NOTE — Telephone Encounter (Signed)
Per Dr. Jana Hakim pt can be rescheduled to see him on 1/31 at [place] to the pt's daughter and made her aware of the appt change. Agreed tot he appt date and time. Aware to arrive 30 minutes early.

## 2016-11-07 ENCOUNTER — Encounter (HOSPITAL_COMMUNITY): Admission: RE | Payer: Self-pay | Source: Ambulatory Visit

## 2016-11-07 ENCOUNTER — Ambulatory Visit (HOSPITAL_BASED_OUTPATIENT_CLINIC_OR_DEPARTMENT_OTHER): Payer: No Typology Code available for payment source | Admitting: Oncology

## 2016-11-07 ENCOUNTER — Ambulatory Visit (HOSPITAL_COMMUNITY): Admission: RE | Admit: 2016-11-07 | Payer: Self-pay | Source: Ambulatory Visit | Admitting: Gastroenterology

## 2016-11-07 ENCOUNTER — Encounter: Payer: Self-pay | Admitting: *Deleted

## 2016-11-07 ENCOUNTER — Encounter: Payer: Self-pay | Admitting: Pharmacist

## 2016-11-07 VITALS — BP 128/48 | HR 73 | Temp 98.3°F | Resp 17 | Ht 62.0 in | Wt 182.7 lb

## 2016-11-07 DIAGNOSIS — D0511 Intraductal carcinoma in situ of right breast: Secondary | ICD-10-CM

## 2016-11-07 DIAGNOSIS — Z170 Estrogen receptor positive status [ER+]: Secondary | ICD-10-CM

## 2016-11-07 DIAGNOSIS — C50311 Malignant neoplasm of lower-inner quadrant of right female breast: Secondary | ICD-10-CM

## 2016-11-07 SURGERY — COLONOSCOPY WITH PROPOFOL
Anesthesia: Monitor Anesthesia Care

## 2016-11-07 NOTE — Progress Notes (Signed)
Oral Chemotherapy Pharmacist Encounter  Dispensed samples to patient  Medication: letrozole 2.5mg  tabs Directions: take 1 tab by mouth daily Quantity dispensed: 60 tabs Days supply: 60 Manufacturer: [employer] Lot: [id number] Exp: 06/2018  Johny Drilling, PharmD, BCPS, BCOP 11/07/2016  12:14 PM [facility] [phone]

## 2016-11-07 NOTE — Progress Notes (Signed)
[place]  [phone] [phone]     ID: Jill Adkins DOB: 1950-11-11  MR#: [id number]  ID:[id number]  Patient Care Team: Alfonse Spruce, FNP as PCP - General (Family Medicine) Chauncey Cruel, MD as Consulting Physician (Oncology) [name], MD as Consulting Physician (General Surgery) Chauncey Cruel, MD OTHER MD:  CHIEF COMPLAINT: Ductal carcinoma in situ  CURRENT TREATMENT: Letrozole   BREAST CANCER HISTORY: The patient had routine screening mammography 10/05/2016 showing abnormal grouped calcifications in the right breast upper outer quadrant. On 10/11/2016 she underwent right diagnostic mammography with tomography. His found the breast density to be category B. In the right breast at the 12:00 position there was an oval asymmetry. There were also grouped calcifications in the right breast at the 7:00 middle depth. Ultrasound was performed the same day. This confirmed a 0.7 cm hypoechoic mass in the right breast upper outer quadrant. The right axilla was benign.  On 10/17/2016 the patient underwent ultrasound-guided biopsy of the 7 mm mass in the right breast and this showed ([id number]) a fibroadenoma, with no evidence of malignancy. 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  Given the multicentric disease, the patient had breast MRI 11/05/2016. There were in addition to the fibroadenoma, 2 hematomas in the inferior right breast  one in the inner and one in the outer quadrant. There was significant surrounding enhancement in the inferior half of the right breast some of which was postprocedural. The biopsy clips were 6.5 cm apart. 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   Her subsequent history is as detailed below  INTERVAL HISTORY: The patient was evaluated in the breast clinic 11/07/2016 accompanied by her daughter [name], who also served as Optometrist  REVIEW OF SYSTEMS: There were no specific symptoms leading to the original mammogram, which was routinely scheduled. The patient denies unusual headaches, visual changes, nausea, vomiting, stiff neck, dizziness, or gait imbalance. There has been no cough, phlegm production, or pleurisy, no chest pain or pressure, and no change in bowel or bladder habits. The patient denies fever, rash, bleeding, unexplained fatigue or unexplained weight loss. A detailed review of systems was otherwise entirely negative.   PAST MEDICAL HISTORY: Past Medical History:  Diagnosis Date  . Breast cancer ([place])    right breast  . GERD (gastroesophageal reflux disease)   . Hyperlipidemia   . Hypertension     PAST SURGICAL HISTORY: No past surgical history on file.  FAMILY HISTORY Family History  Problem Relation Age of Onset  . Hypertension Father   The patient's father died at the age of 7, with a history of hypertension; the patient's mother is alive at age 15. The patient had 3 brothers 2 sisters. There is no history of cancer in the family to her knowledge.  GYNECOLOGIC HISTORY:  Menarche age 3, first live birth age 37. The patient is GX P3. She does not remember when she stopped having  periods. She did not use oral contraceptives. She used birth control pills remotely with no complications. No LMP recorded. Patient is postmenopausal.   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 She describes herself is single. She is currently residing with her daughter [name] in [place]. [name] works in Oceanographer. [name] lives in [place] and is a Scientist, clinical (histocompatibility and immunogenetics) and Electrical engineer lives in [place] where she is studying. The patient has 11 grandchildren. The patient attends the local mosque  ADVANCED DIRECTIVES: Not in place   HEALTH MAINTENANCE: Social History  Substance Use Topics  . Smoking status: Never Smoker  . Smokeless tobacco: Never Used  . Alcohol use No     Colonoscopy:Never  PAP:  Bone density: Never   No Known Allergies  Current Outpatient Prescriptions  Medication Sig Dispense Refill  . acetaminophen (TYLENOL) 500 MG tablet Take 2 tablets (1,000 mg total) by mouth every 8 (eight) hours as needed for headache. 60 tablet 0  . amLODipine (NORVASC) 5 MG tablet Take 1 tablet (5 mg total) by mouth daily. 90 tablet 0  . docusate sodium (COLACE) 100 MG capsule Take 1 capsule (100 mg total) by mouth 2 (two) times daily. (Patient taking differently: Take 100 mg by mouth daily as needed. ) 180 capsule 0  . montelukast (SINGULAIR) 10 MG tablet Take 1 tablet (10 mg total) by mouth at bedtime. 90 tablet 0  . ranitidine (ZANTAC) 150 MG tablet Take 1 tablet (150 mg total) by mouth 2 (two) times daily. 180 tablet 0  . rosuvastatin (CRESTOR) 10 MG tablet Take 1 tablet (10 mg total) by mouth daily. 90 tablet 0   No current facility-administered medications for this visit.     OBJECTIVE: Middle-aged African woman in no acute distress Vitals:   11/07/16 1044  BP: (!) 128/48  Pulse: 73  Resp: 17  Temp: 98.3 F (36.8 C)     Body mass index is 33.42 kg/m.    ECOG FS:0 - Asymptomatic  Ocular: Sclerae unicteric, pupils equal, round and reactive to light Ear-nose-throat: Oropharynx clear and moist Lymphatic: No cervical or supraclavicular adenopathy Lungs no rales or rhonchi, good excursion bilaterally Heart regular rate and rhythm, no  murmur appreciated Abd soft, obese, nontender, positive bowel sounds MSK no focal spinal tenderness, no joint edema Neuro: non-focal, well-oriented, appropriate affect Breasts: The right breast is status post recent biopsy. I do not palpate a mass. The right axilla is benign. The left breast shows also no palpable masses in the left axilla is benign. There are no skin or nipple changes of concern in either breast.   LAB RESULTS:  CMP     Component Value Date/Time   NA 142 10/04/2016 1001   K 4.0 10/04/2016 1001   CL 105 10/04/2016 1001   CO2 27 10/04/2016 1001   GLUCOSE 105 (H) 10/04/2016 1001   BUN 12 10/04/2016 1001   CREATININE 0.74 10/04/2016 1001   CALCIUM 9.7 10/04/2016 1001   PROT 7.6 10/04/2016 1023   ALBUMIN 4.4 10/04/2016 1023   AST 18 10/04/2016 1023   ALT 12 10/04/2016 1023   ALKPHOS 72 10/04/2016 1023   BILITOT 0.6 10/04/2016 1023    INo results found for: SPEP, UPEP  Lab Results  Component Value Date   WBC 4.0 10/04/2016   NEUTROABS 1,120 (L) 10/04/2016   HGB 13.9 10/04/2016   HCT 42.1 10/04/2016   MCV 81.1 10/04/2016   PLT 349 10/04/2016  Chemistry      Component Value Date/Time   NA 142 10/04/2016 1001   K 4.0 10/04/2016 1001   CL 105 10/04/2016 1001   CO2 27 10/04/2016 1001   BUN 12 10/04/2016 1001   CREATININE 0.74 10/04/2016 1001      Component Value Date/Time   CALCIUM 9.7 10/04/2016 1001   ALKPHOS 72 10/04/2016 1023   AST 18 10/04/2016 1023   ALT 12 10/04/2016 1023   BILITOT 0.6 10/04/2016 1023       No results found for: LABCA2  No components found for: LABCA125  No results for input(s): INR in the last 168 hours.  Urinalysis    Component Value Date/Time   COLORURINE YELLOW 06/06/2009 1613   APPEARANCEUR CLEAR 06/06/2009 1613   LABSPEC 1.020 06/06/2009 1613   PHURINE 6.5 06/06/2009 1613   GLUCOSEU NEGATIVE 06/06/2009 1613   HGBUR NEGATIVE 06/06/2009 1613   BILIRUBINUR NEGATIVE 06/06/2009 1613   KETONESUR NEGATIVE  06/06/2009 1613   PROTEINUR NEGATIVE 06/06/2009 1613   UROBILINOGEN 0.2 06/06/2009 1613   NITRITE NEGATIVE 06/06/2009 1613   LEUKOCYTESUR  06/06/2009 1613    NEGATIVE MICROSCOPIC NOT DONE ON URINES WITH NEGATIVE PROTEIN, BLOOD, LEUKOCYTES, NITRITE, OR GLUCOSE <1000 mg/dL.     STUDIES: Mr Breast Bilateral W Wo Contrast  Result Date: 11/05/2016 CLINICAL DATA:  The patient has had 3 biopsies in the right breast. The mass at 12 o'clock is a fibroadenoma. The calcifications in the lower inner and lower outer quadrants represent DCIS. LABS:  GFR 96 EXAM: BILATERAL BREAST MRI WITH AND WITHOUT CONTRAST TECHNIQUE: Multiplanar, multisequence MR images of both breasts were obtained prior to and following the intravenous administration of 17 ml of MultiHance. THREE-DIMENSIONAL MR IMAGE RENDERING ON INDEPENDENT WORKSTATION: Three-dimensional MR images were rendered by post-processing of the original MR data on an independent workstation. The three-dimensional MR images were interpreted, and findings are reported in the following complete MRI report for this study. Three dimensional images were evaluated at the independent DynaCad workstation COMPARISON:  Previous exam(s). FINDINGS: Breast composition: b. Scattered fibroglandular tissue. Background parenchymal enhancement: Moderate. Right breast: There is a mass superiorly in the right breast which correlates with the patient's known fibroadenoma. There is an immediately adjacent biopsy clip. Two hematomas are seen in the inferior right breast, 1 in the inner quadrant and 1 in the outer quadrant. These represent sites of known DCIS. There is significant surrounding enhancement in the inferior half of the right breast. At least some of this enhancement is postprocedural. Left breast: There is linear enhancement in the anterior aspect of the lower outer left breast, best appreciated on MIP imaging. The level of enhancement in this region is higher than elsewhere in the  left breast. This enhancement spans approximately 12 mm with persistent kinetics, best seen on series 12, image 84. Lymph nodes: No abnormal appearing lymph nodes. Ancillary findings:  None. IMPRESSION: 1. Two moderate to large hematomas in the inferior right breast correlate with the known sites of DCIS. The biopsy clips on mammography are 6.5 cm apart. Much of the enhancement may be postprocedural, especially given the large hematomas. It would be difficult to differentiate between post biopsy enhancement and remaining DCIS. However, based on the multi centric disease, 6.5 cm apart, the patient is likely a mastectomy candidate. 2. There is linear enhancement in the anterior aspect of the lower outer left breast, best seen on MIP imaging and series 12, image 84 which demonstrates persistent enhancement kinetics. Given the appearance of  this linear non mass enhancement and the fact that the patient has known DCIS on the right, this is moderately suspicious for DCIS. RECOMMENDATION: Recommend MRI guided biopsy of the linear enhancement in the lower outer left breast. BI-RADS CATEGORY  4: Suspicious. Electronically Signed   By: Dorise Bullion III M.D   On: 11/05/2016 11:00    ELIGIBLE FOR AVAILABLE RESEARCH PROTOCOL:   ASSESSMENT: 67 y.o. [place] speaker originally from [place] status post right breast upper outer and lower outer quadrant biopsies 10/17/2016 and 10/29/2016, both showing ductal carcinoma in situ, grade 2, strongly estrogen and progesterone receptor positive.  (a) the patient wishes to postpone definitive surgery for travel reasons  (1) started letrozole 11/07/2016  (2) will need repeat MRI prior to definitive surgery with consideration of MRI guided biopsy of the left breast suspicious area previously noted  (3) definitive surgery pending  (4) to continue anti-estrogens for a total of 5 years  PLAN: We spent the better part of today's hour-long appointment discussing the biology of  breast cancer in general, and the specifics of the patient's tumor in particular. Marisa understands that in noninvasive ductal carcinoma, also called ductal carcinoma in situ ("DCIS") the breast cancer cells remain trapped in the ducts were they started. They cannot travel to a vital organ. For that reason these cancers in themselves are not life-threatening.  If the whole breast is removed then all the ducts are removed and since the cancer cells are trapped in the ducts, the cure rate with mastectomy for noninvasive breast cancer is approximately 99%. Nevertheless we recommend lumpectomy, because there is no survival advantage to mastectomy and because the cosmetic result is generally superior with breast conservation.  Since the patient is keeping her breasts, there will be some risk of recurrence. The recurrence can only be in the same breast since, again, the cells are trapped in the ducts. There is no connection from one breast to the other. The risk of local recurrence is cut by more than half with radiation, which is standard in this situation.  In estrogen receptor positive cancers like @FTNAME @'s, anti-estrogens can also be considered. They will further reduce the risk of recurrence by one half. In addition anti-estrogens will lower the risk of a new breast cancer developing in either breast, also by one half. That risk approaches 1% per year. Anti-estrogens reduce it to 1/2%.  Accordingly the overall plan is for surgery, followed by radiation, then a discussion of anti-estrogens. However there are 2 issues. The first is that the recent MRI of the breast showed a lesion in the left breast that we would like to have biopsied before definitive surgery. The second issue is that the patient is leaving for [place] in 2 days.  Accordingly we are going to temporize by starting the patient on anti-estrogens. We discussed the difference between tamoxifen and aromatase inhibitors in detail and the patient has  a good understanding of the possible toxicities, side effects and complications of these agents. I gave her a 5 month supply of letrozole, which should carry her through to her return to the [place] in May.  When she [place] the states we will repeat a breast MRI and if there is a persistent abnormality in the left breast we will consider MRI guided biopsy at that time.  The patient has a good understanding of the overall plan. She agrees with it. She knows the goal of treatment in her case is cure. She will call with any  problems that may develop before her next visit here.  Chauncey Cruel, MD   11/08/2016 7:04 PM Medical Oncology and Hematology [facility] [address] [place], [place] [address] Tel. [phone]    Fax. [phone]

## 2016-11-08 ENCOUNTER — Ambulatory Visit: Payer: Self-pay | Admitting: Radiation Oncology

## 2016-11-08 ENCOUNTER — Ambulatory Visit: Payer: Self-pay

## 2016-11-08 ENCOUNTER — Other Ambulatory Visit: Payer: Self-pay

## 2016-11-08 ENCOUNTER — Ambulatory Visit: Payer: Self-pay | Admitting: Oncology

## 2017-01-29 ENCOUNTER — Encounter: Payer: Self-pay | Admitting: Internal Medicine

## 2017-01-29 ENCOUNTER — Other Ambulatory Visit: Payer: Self-pay | Admitting: Oncology

## 2017-01-29 DIAGNOSIS — C50311 Malignant neoplasm of lower-inner quadrant of right female breast: Secondary | ICD-10-CM

## 2017-01-29 DIAGNOSIS — Z170 Estrogen receptor positive status [ER+]: Principal | ICD-10-CM

## 2017-02-07 ENCOUNTER — Other Ambulatory Visit: Payer: Self-pay | Admitting: Surgery

## 2017-02-07 DIAGNOSIS — N63 Unspecified lump in unspecified breast: Secondary | ICD-10-CM

## 2017-02-18 ENCOUNTER — Ambulatory Visit
Admission: RE | Admit: 2017-02-18 | Discharge: 2017-02-18 | Disposition: A | Discharge status: Home / Self Care | Payer: No Typology Code available for payment source | Source: Ambulatory Visit | Attending: Surgery | Admitting: Surgery

## 2017-02-18 DIAGNOSIS — N63 Unspecified lump in unspecified breast: Secondary | ICD-10-CM

## 2017-02-18 MED ORDER — GADOBENATE DIMEGLUMINE 529 MG/ML IV SOLN
16.0000 mL | Freq: Once | INTRAVENOUS | Status: AC | PRN
  Administered 2017-02-18: 16 mL via INTRAVENOUS

## 2017-02-20 MED FILL — AMLODIPINE BESYLATE 5 MG TA: 5 | 30 days supply | Qty: 30 | Fill #1

## 2017-02-21 ENCOUNTER — Other Ambulatory Visit: Payer: Self-pay | Admitting: *Deleted

## 2017-02-21 ENCOUNTER — Telehealth: Payer: Self-pay

## 2017-02-21 NOTE — Telephone Encounter (Signed)
This RN reviewed chart and noted MRI order with requested date for later this month.  Referral status states " incomplete ".  This RN sent message to [facility] per above so MRI can be obtained for further treatment decisions.  This RN returned call to pt to inform her of current status and plan- obtained VM - request left to return call to this RN.

## 2017-02-21 NOTE — Telephone Encounter (Signed)
The pt is returned from [place] and has had MRI and bx on L breast. She is asking what is the next step. Her appt is for 03/25/17 and she is wanting to be seen sooner b/c she is back from [place].  She is asking what about the right breast.

## 2017-02-22 ENCOUNTER — Other Ambulatory Visit: Payer: Self-pay | Admitting: *Deleted

## 2017-02-26 ENCOUNTER — Ambulatory Visit: Payer: No Typology Code available for payment source | Admitting: Family Medicine

## 2017-02-27 ENCOUNTER — Telehealth: Payer: Self-pay | Admitting: *Deleted

## 2017-02-27 NOTE — Telephone Encounter (Signed)
This RN received call from pt's daughter - [name] - who is inquiring about date for MRI so pt can be seen by MD for further treatment concerns.  See other notes per need to schedule MRI.

## 2017-03-06 ENCOUNTER — Ambulatory Visit: Payer: No Typology Code available for payment source | Admitting: Family Medicine

## 2017-03-06 ENCOUNTER — Ambulatory Visit (HOSPITAL_COMMUNITY)
Admission: RE | Admit: 2017-03-06 | Discharge: 2017-03-06 | Disposition: A | Discharge status: Home / Self Care | Payer: Self-pay | Source: Ambulatory Visit | Attending: Oncology | Admitting: Oncology

## 2017-03-06 DIAGNOSIS — C50311 Malignant neoplasm of lower-inner quadrant of right female breast: Secondary | ICD-10-CM | POA: Insufficient documentation

## 2017-03-06 DIAGNOSIS — Z170 Estrogen receptor positive status [ER+]: Secondary | ICD-10-CM | POA: Insufficient documentation

## 2017-03-06 LAB — POCT I-STAT CREATININE: CREATININE: 0.7 mg/dL (ref 0.44–1.00)

## 2017-03-06 MED ORDER — GADOBENATE DIMEGLUMINE 529 MG/ML IV SOLN
15.0000 mL | Freq: Once | INTRAVENOUS | Status: AC | PRN
  Administered 2017-03-06: 14 mL via INTRAVENOUS

## 2017-03-07 ENCOUNTER — Ambulatory Visit: Payer: Self-pay | Attending: Family Medicine | Admitting: Family Medicine

## 2017-03-07 ENCOUNTER — Telehealth: Payer: Self-pay | Admitting: Family Medicine

## 2017-03-07 ENCOUNTER — Other Ambulatory Visit: Payer: Self-pay | Admitting: Family Medicine

## 2017-03-07 VITALS — BP 111/73 | HR 67 | Temp 98.1°F | Resp 18 | Ht 61.0 in | Wt 174.8 lb

## 2017-03-07 DIAGNOSIS — Z760 Encounter for issue of repeat prescription: Secondary | ICD-10-CM

## 2017-03-07 DIAGNOSIS — I10 Essential (primary) hypertension: Secondary | ICD-10-CM | POA: Insufficient documentation

## 2017-03-07 DIAGNOSIS — R202 Paresthesia of skin: Secondary | ICD-10-CM

## 2017-03-07 DIAGNOSIS — K0889 Other specified disorders of teeth and supporting structures: Secondary | ICD-10-CM

## 2017-03-07 DIAGNOSIS — R51 Headache: Secondary | ICD-10-CM | POA: Insufficient documentation

## 2017-03-07 DIAGNOSIS — E782 Mixed hyperlipidemia: Secondary | ICD-10-CM

## 2017-03-07 DIAGNOSIS — R200 Anesthesia of skin: Secondary | ICD-10-CM

## 2017-03-07 DIAGNOSIS — R519 Headache, unspecified: Secondary | ICD-10-CM

## 2017-03-07 DIAGNOSIS — H547 Unspecified visual loss: Secondary | ICD-10-CM

## 2017-03-07 DIAGNOSIS — K219 Gastro-esophageal reflux disease without esophagitis: Secondary | ICD-10-CM | POA: Insufficient documentation

## 2017-03-07 DIAGNOSIS — E785 Hyperlipidemia, unspecified: Secondary | ICD-10-CM | POA: Insufficient documentation

## 2017-03-07 LAB — POCT UA - MICROALBUMIN
CREATININE, POC: 50 mg/dL
Microalbumin Ur, POC: 30 mg/L

## 2017-03-07 MED ORDER — ACETAMINOPHEN 500 MG PO TABS: 1000.0000 mg | ORAL_TABLET | Freq: Four times a day (QID) | ORAL | 0 refills | Status: DC | PRN

## 2017-03-07 MED ORDER — RANITIDINE HCL 150 MG PO TABS: 150.0000 mg | ORAL_TABLET | Freq: Two times a day (BID) | ORAL | 1 refills | Status: DC | PRN

## 2017-03-07 MED ORDER — AMLODIPINE BESYLATE 5 MG PO TABS: 5.0000 mg | ORAL_TABLET | Freq: Every day | ORAL | 0 refills | Status: DC

## 2017-03-07 MED ORDER — MONTELUKAST SODIUM 10 MG PO TABS: 10.0000 mg | ORAL_TABLET | Freq: Every day | ORAL | 0 refills | Status: DC

## 2017-03-07 NOTE — Telephone Encounter (Signed)
Dental referral order placed.

## 2017-03-07 NOTE — Progress Notes (Signed)
Patient is here for f/up  Patient has not taking her medication for today   Patient has not eaten for today   Patient complains about both ears itchy, tooth pain & her eyes being watery

## 2017-03-07 NOTE — Patient Instructions (Signed)

## 2017-03-07 NOTE — Telephone Encounter (Signed)
Patient was in the office today and forgot to mention to her provider that she needs a dental referral. Pt stated that she is in a lot of pain and needs tooth extracted. Informed patient that there's a waiting period for dental referrals.   Thank you.

## 2017-03-07 NOTE — Progress Notes (Signed)
Subjective:  Patient ID: Jill Adkins, female    DOB: 1951/06/09  Age: 66 y.o. MRN: [id number]  CC: Hypertension  HPI Jill Adkins presents for   Jill Adkins 66 y.o. female presents with for follow up of HTN, GERD,and HLD. She also has complaints of headaches. Recent diagnosis of breast cancer diagnosed January 2018 .Marland KitchenShe repots previous symptoms of epigastric pain and heartburn controlled don current medication. She denies nay abdominal pain, dysphagia, hematochezia, melena, and weight loss. She denies any CP, SOB, or swelling of the extremities.  Headache: Patient presents with headache complaint. Symptoms began about 4 months ago. She reports crawling sensations. Associated symptoms include dizziness. She denies any ear pain or tinnitus. Denies taking anything for symptoms. Symptoms relieved by nothing. Denies any nausea or vomiting or difficulty keep her balance. She does reports decreased visual acuity. History of corrective lens use. She does report history of new oncology medication use 4 months ago.    Outpatient Medications Prior to Visit  Medication Sig Dispense Refill  . rosuvastatin (CRESTOR) 10 MG tablet Take 1 tablet (10 mg total) by mouth daily. 90 tablet 0  . acetaminophen (TYLENOL) 500 MG tablet Take 2 tablets (1,000 mg total) by mouth every 8 (eight) hours as needed for headache. 60 tablet 0  . amLODipine (NORVASC) 5 MG tablet Take 1 tablet (5 mg total) by mouth daily. 90 tablet 0  . montelukast (SINGULAIR) 10 MG tablet Take 1 tablet (10 mg total) by mouth at bedtime. 90 tablet 0  . ranitidine (ZANTAC) 150 MG tablet Take 1 tablet (150 mg total) by mouth 2 (two) times daily. 180 tablet 0   No facility-administered medications prior to visit.     ROS Review of Systems  Constitutional: Negative.   Eyes: Positive for visual disturbance.  Respiratory: Negative.   Cardiovascular: Negative.   Gastrointestinal: Negative.   Neurological: Positive for dizziness and headaches.     Parathesias    Objective:  BP 111/73   Pulse 67   Temp 98.1 F (36.7 C) (Oral)   Resp 18   Ht 5\' 1"  (1.549 m)   Wt 174 lb 12.8 oz (79.3 kg)   SpO2 97%   BMI 33.03 kg/m   BP/Weight 03/07/2017 11/07/2016 2/29/7989  Systolic BP 211 941 740  Diastolic BP 73 48 71  Wt. (Lbs) 174.8 182.7 179.2  BMI 33.03 33.42 32.78     Physical Exam  Constitutional: She is oriented to person, place, and time. She appears well-developed and well-nourished.  HENT:  Head: Normocephalic and atraumatic.  Right Ear: External ear normal.  Left Ear: External ear normal.  Nose: Nose normal.  Mouth/Throat: Oropharynx is clear and moist.  Eyes: Conjunctivae are normal. Pupils are equal, round, and reactive to light.  Neck: No JVD present.  Cardiovascular: Normal rate, regular rhythm, normal heart sounds and intact distal pulses.   Pulmonary/Chest: Effort normal and breath sounds normal.  Abdominal: Soft. Bowel sounds are normal. There is no tenderness.  Neurological: She is alert and oriented to person, place, and time. She has normal strength and normal reflexes. She displays a negative Romberg sign.  Skin: Skin is warm and dry.  Nursing note and vitals reviewed.   Assessment & Plan:   Problem List Items Addressed This Visit      Other   Hyperlipidemia   Relevant Medications   amLODipine (NORVASC) 5 MG tablet   Other Relevant Orders   Lipid Panel (Completed)    Other Visit Diagnoses  Chronic nonintractable headache, unspecified headache type    -  Primary   Relevant Medications   amLODipine (NORVASC) 5 MG tablet   acetaminophen (TYLENOL) 500 MG tablet   Other Relevant Orders   CT Head Wo Contrast   Numbness and tingling       Relevant Orders   CT Head Wo Contrast   Chronic GERD       Relevant Medications   ranitidine (ZANTAC) 150 MG tablet   Essential hypertension       Relevant Medications   amLODipine (NORVASC) 5 MG tablet   Other Relevant Orders   Lipid Panel (Completed)    POCT UA - Microalbumin (Completed)   Decreased visual acuity       -Visual acuity screen.   Relevant Orders   Ambulatory referral to Ophthalmology   Medication refill       Relevant Medications   montelukast (SINGULAIR) 10 MG tablet      Meds ordered this encounter  Medications  . amLODipine (NORVASC) 5 MG tablet    Sig: Take 1 tablet (5 mg total) by mouth daily.    Dispense:  90 tablet    Refill:  0  . ranitidine (ZANTAC) 150 MG tablet    Sig: Take 1 tablet (150 mg total) by mouth 2 (two) times daily as needed for heartburn.    Dispense:  90 tablet    Refill:  1  . montelukast (SINGULAIR) 10 MG tablet    Sig: Take 1 tablet (10 mg total) by mouth at bedtime.    Dispense:  90 tablet    Refill:  0  . acetaminophen (TYLENOL) 500 MG tablet    Sig: Take 2 tablets (1,000 mg total) by mouth every 6 (six) hours as needed for headache.    Dispense:  60 tablet    Refill:  0    Follow-up: Return in about 3 months (around 06/07/2017), or if symptoms worsen or fail to improve.   Alfonse Spruce FNP

## 2017-03-08 LAB — LIPID PANEL
Chol/HDL Ratio: 3.5 ratio (ref 0.0–4.4)
Cholesterol, Total: 237 mg/dL — ABNORMAL HIGH (ref 100–199)
HDL: 67 mg/dL (ref >39)
LDL Calculated: 152 mg/dL — ABNORMAL HIGH (ref 0–99)
Triglycerides: 89 mg/dL (ref 0–149)
VLDL Cholesterol Cal: 18 mg/dL (ref 5–40)

## 2017-03-11 ENCOUNTER — Ambulatory Visit (HOSPITAL_COMMUNITY)
Admission: RE | Admit: 2017-03-11 | Discharge: 2017-03-11 | Disposition: A | Discharge status: Home / Self Care | Payer: Self-pay | Source: Ambulatory Visit | Attending: Family Medicine | Admitting: Family Medicine

## 2017-03-11 ENCOUNTER — Other Ambulatory Visit: Payer: Self-pay | Admitting: Family Medicine

## 2017-03-11 DIAGNOSIS — G8929 Other chronic pain: Secondary | ICD-10-CM | POA: Insufficient documentation

## 2017-03-11 DIAGNOSIS — R202 Paresthesia of skin: Secondary | ICD-10-CM | POA: Insufficient documentation

## 2017-03-11 DIAGNOSIS — R200 Anesthesia of skin: Secondary | ICD-10-CM | POA: Insufficient documentation

## 2017-03-11 DIAGNOSIS — R51 Headache: Secondary | ICD-10-CM | POA: Insufficient documentation

## 2017-03-11 DIAGNOSIS — E782 Mixed hyperlipidemia: Secondary | ICD-10-CM

## 2017-03-11 MED ORDER — ROSUVASTATIN CALCIUM 40 MG PO TABS: 40.0000 mg | ORAL_TABLET | Freq: Every day | ORAL | 0 refills | Status: DC

## 2017-03-11 MED FILL — ROSUVASTATIN CALCIUM 40 MG: 40 | 30 days supply | Qty: 30 | Fill #0

## 2017-03-12 ENCOUNTER — Telehealth: Payer: Self-pay | Admitting: *Deleted

## 2017-03-12 NOTE — Telephone Encounter (Signed)
CMA call regarding lab results   Patient daughter  Verify DOB  Patient daughter was aware and understood

## 2017-03-12 NOTE — Telephone Encounter (Signed)
-----   Message from Alfonse Spruce, Eva sent at 03/11/2017 12:49 PM EDT ----- -Lipid levels were elevated. This can increase your risk of heart disease. Your dose of rosuvastatin will be increased.  -Recommend follow up in 3 months.

## 2017-03-12 NOTE — Telephone Encounter (Signed)
Per MD review of MRI of breast for follow up per use of letrozole for neoadjuvant therapy while pt was in [place] - recommendation is for the patient to proceed to surgical consult at this time.  Above discussed with pt's daughter - [name]- who verbalized understanding.  This RN contacted [facility]/Dr Blackmon's office to request appointment and was requested to fax information for scheduling.  Data faxed to [phone] attention Gabriel Cirri

## 2017-03-15 ENCOUNTER — Telehealth: Payer: Self-pay

## 2017-03-15 NOTE — Telephone Encounter (Signed)
-----   Message from Alfonse Spruce, [place] sent at 03/13/2017  2:18 PM EDT ----- No abnormality. No masses or lesions, blockages, bleeding, or swelling present. Old fracture to the bone surrounding the left eyeball noted.  If symptoms persist recommend referral to neurology.

## 2017-03-15 NOTE — Telephone Encounter (Signed)
CMA call regarding lab results   Patient did not answer but left a Vm stating the reason of the call & to call back  

## 2017-03-15 NOTE — Telephone Encounter (Signed)
Pt. Returned nurse call. Please f/u with pt.  °

## 2017-03-18 NOTE — Telephone Encounter (Signed)
Patient daughter  return CMA call regarding lab results  Patient daughter was aware and understood

## 2017-03-19 ENCOUNTER — Ambulatory Visit (INDEPENDENT_AMBULATORY_CARE_PROVIDER_SITE_OTHER): Payer: Self-pay | Admitting: Physician Assistant

## 2017-03-19 ENCOUNTER — Encounter: Payer: Self-pay | Admitting: Physician Assistant

## 2017-03-19 ENCOUNTER — Ambulatory Visit: Payer: Self-pay | Admitting: *Deleted

## 2017-03-19 ENCOUNTER — Other Ambulatory Visit: Payer: Self-pay | Admitting: Surgery

## 2017-03-19 VITALS — BP 122/82 | HR 62 | Ht 61.0 in | Wt 172.0 lb

## 2017-03-19 VITALS — Ht 61.0 in | Wt 173.8 lb

## 2017-03-19 DIAGNOSIS — Z800 Family history of malignant neoplasm of digestive organs: Secondary | ICD-10-CM

## 2017-03-19 DIAGNOSIS — K219 Gastro-esophageal reflux disease without esophagitis: Secondary | ICD-10-CM

## 2017-03-19 DIAGNOSIS — D0511 Intraductal carcinoma in situ of right breast: Secondary | ICD-10-CM

## 2017-03-19 DIAGNOSIS — Z1211 Encounter for screening for malignant neoplasm of colon: Secondary | ICD-10-CM

## 2017-03-19 DIAGNOSIS — K5900 Constipation, unspecified: Secondary | ICD-10-CM

## 2017-03-19 MED ORDER — POLYETHYLENE GLYCOL 3350 17 GM/SCOOP PO POWD: ORAL | 3 refills | Status: DC

## 2017-03-19 MED ORDER — FAMOTIDINE 40 MG PO TABS: 40.0000 mg | ORAL_TABLET | Freq: Two times a day (BID) | ORAL | 11 refills | Status: DC

## 2017-03-19 MED ORDER — NA SULFATE-K SULFATE-MG SULF 17.5-3.13-1.6 GM/177ML PO SOLN: 1.0000 | Freq: Once | ORAL | 0 refills | Status: AC

## 2017-03-19 MED FILL — POLYETHYLENE GLYCOL 3350: 15 days supply | Qty: 255 | Fill #0

## 2017-03-19 MED FILL — ?FAMOTIDINE 40 MG TABLETS: 40 MG | 30 days supply | Qty: 60 | Fill #0

## 2017-03-19 MED FILL — AMLODIPINE BESYLATE 5 MG TA: 5 | 30 days supply | Qty: 30 | Fill #0

## 2017-03-19 NOTE — Patient Instructions (Signed)
We have sent the following medications to your pharmacy for you to pick up at your convenience: [place]  1. Pepcid 40 mg  You may try Miralax, 17 grams in 8 oz of water  You have been scheduled for a colonoscopy. Please follow written instructions given to you at your visit today.  We  Have given you the sample colonoscopy prep. If you use inhalers (even only as needed), please bring them with you on the day of your procedure. Your physician has requested that you go to www.startemmi.com and enter the access code given to you at your visit today. This web site gives a general overview about your procedure. However, you should still follow specific instructions given to you by our office regarding your preparation for the procedure.

## 2017-03-19 NOTE — Progress Notes (Addendum)
Subjective:    Patient ID: Jill Adkins, female    DOB: Mar 03, 1951, 66 y.o.   MRN: [id number]  HPI Jill Adkins  is a pleasant 66 year old [place] female, new to GI today referred by Jill Arvin NP initially for screening colonoscopy. She had a nurse visit for scheduling and then added on for an office visit due to other GI complaints. Patient has not had any higher colonoscopy. She does complain of constipation long-term and usually manages with a laxative tea. She has not noticed any rectal bleeding. She does have family history of colon cancer in her brother. Patient also has complaint of chronic GERD. She says she had been on medication for 5-10 years. She had taken omeprazole fairly long-term but when off of that a while back because she wasn't sure about long-term side effects. She has been using Zantac in the antrum which wasn't helping much. She currently having frequent symptoms several times per week. Symptoms seem to be exacerbated on an empty stomach or with larger meals and she is bothered more in the evenings. She has no complaints of dysphagia or odynophagia. No prior EGD. She also mentions that she has just started back on Crestor which she had taken in the past and developed a sore tongue into feeling of swelling in her tongue. She is having no same symptoms again over the past couple of days. Other medical problems include hypertension, history of breast cancer, hyperlipidemia.  Review of Systems Pertinent positive and negative review of systems were noted in the above HPI section.  All other review of systems was otherwise negative.  Outpatient Encounter Prescriptions as of 03/19/2017  Medication Sig  . acetaminophen (TYLENOL) 500 MG tablet Take 2 tablets (1,000 mg total) by mouth every 6 (six) hours as needed for headache.  Marland Kitchen amLODipine (NORVASC) 5 MG tablet Take 1 tablet (5 mg total) by mouth daily.  . diphenhydrAMINE (BENADRYL) 25 MG tablet Take 25 mg by mouth every 6 (six) hours  as needed.  Marland Kitchen letrozole (FEMARA) 2.5 MG tablet Take 2.5 mg by mouth daily.  . Na Sulfate-K Sulfate-Mg Sulf 17.5-3.13-1.6 GM/180ML SOLN Take 1 kit by mouth once. suprep as directed. No substitutions.  Marland Kitchen omeprazole (PRILOSEC) 40 MG capsule Take 40 mg by mouth daily.  . rosuvastatin (CRESTOR) 40 MG tablet Take 1 tablet (40 mg total) by mouth daily.  . famotidine (PEPCID) 40 MG tablet Take 1 tablet (40 mg total) by mouth 2 (two) times daily.  . polyethylene glycol powder (GLYCOLAX/MIRALAX) powder Take 1 dose, 17 grams in 8 oz of water as needed for constipation.  . [DISCONTINUED] ranitidine (ZANTAC) 150 MG tablet Take 1 tablet (150 mg total) by mouth 2 (two) times daily as needed for heartburn. (Patient not taking: Reported on 03/19/2017)   No facility-administered encounter medications on file as of 03/19/2017.    No Known Allergies Patient Active Problem List   Diagnosis Date Noted  . Ductal carcinoma in situ (DCIS) of right breast 11/07/2016  . 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 Hyperlipidemia 09/24/2016   Social History   Social History  . Marital status: Widowed    Spouse name: N/A  . Number of children: N/A  . Years of education: N/A   Occupational History  . Not on file.   Social History Main Topics  . Smoking status: Never Smoker  . Smokeless tobacco: Never Used  . Alcohol use No  . Drug use: No  .  Sexual activity: No   Other Topics Concern  . Not on file   Social History Narrative  . No narrative on file    [name]'s family history includes Colon cancer (age of onset: 66) in her brother; Hypertension in her father.      Objective:    Vitals:   03/19/17 1333  BP: 122/82  Pulse: 62    Physical Exam  well-developed well-developed [place] female in no acute distress, accompanied by a family member who does interpretation. Blood pressure 122/82 pulse 62, BMI 32.5. HEENT; nontraumatic  normocephalic EOMI PERRLA sclera anicteric, Cardiovascular ;regular rate and rhythm with S1-S2 no murmur rub or gallop, Pulmonary ;clear bilaterally, Abdomen, nontender nondistended bowel sounds are active there is no palpable mass or hepatosplenomegaly , Rectal ;exam not done, Extremities; no clubbing cyanosis or edema skin warm and dry, Neuropsych ;mood and affect appropriate       Assessment & Plan:   #60 66 year old non-English-speaking [place] female referred for screening colonoscopy, asymptomatic very Patient does have family history of colon cancer in her brother #2 is no history of breast cancer #3 hypertension #4 hyperlipidemia #5 chronic GERD-on long-term PPI therapy, until recently. Off PPI therapy symptoms have recurred and are not well controlled with Zantac #6 tongue soreness and swelling on Crestor  Plan; patient will be scheduled for colonoscopy and EGD with Dr. Hilarie Adkins. Both procedures discussed in detail with patient including risks and benefits and she is agreeable to proceed Patient has to stop the Crestor and talk with her primary care provider regarding alternative medicine as it sounds as if she has been having adverse medication reaction. Discussed antireflux regimen. We'll give her a trial of Pepcid 40 mg by mouth twice a day-prescription sent. If this is ineffective may need to go back to low-dose PPI.  Jill Adkins S Jill Ewen PA-C 03/19/2017   Cc: Jill Adkins, F*  Addendum: Reviewed and agree with initial management. Jill Adkins, Jill Lines, MD

## 2017-03-19 NOTE — Progress Notes (Signed)
Denies allergies to eggs or soy products. Denies complications with sedation or anesthesia. Denies O2 use. Denies use of diet or weight loss medications.  Emmi instructions given for colonoscopy.  [name], daughter interpreter for majority of appointment as interpreter did not arrive until 1048 am. Interpreter Amina.

## 2017-03-21 ENCOUNTER — Ambulatory Visit (AMBULATORY_SURGERY_CENTER): Payer: Self-pay | Admitting: Internal Medicine

## 2017-03-21 ENCOUNTER — Encounter: Payer: Self-pay | Admitting: Internal Medicine

## 2017-03-21 VITALS — BP 134/70 | HR 59 | Temp 97.8°F | Resp 15 | Ht 61.0 in | Wt 172.0 lb

## 2017-03-21 DIAGNOSIS — Z800 Family history of malignant neoplasm of digestive organs: Secondary | ICD-10-CM

## 2017-03-21 DIAGNOSIS — K219 Gastro-esophageal reflux disease without esophagitis: Secondary | ICD-10-CM

## 2017-03-21 DIAGNOSIS — K2950 Unspecified chronic gastritis without bleeding: Secondary | ICD-10-CM

## 2017-03-21 MED ORDER — SODIUM CHLORIDE 0.9 % IV SOLN
500.0000 mL | INTRAVENOUS | Status: DC
Start: 2017-03-21 — End: 2024-07-14

## 2017-03-21 NOTE — Op Note (Signed)
[facility] Patient Name: Jill Adkins Procedure Date: 03/21/2017 4:04 PM MRN: [id number] Endoscopist: Jerene Bears , MD Age: 66 Referring MD:  Date of Birth: Aug 29, 1951 Gender: Female Account #: [id number] Procedure:                Upper GI endoscopy Indications:              Gastro-esophageal reflux disease Medicines:                Monitored Anesthesia Care Procedure:                Pre-Anesthesia Assessment:                           - Prior to the procedure, a History and Physical                            was performed, and patient medications and                            allergies were reviewed. The patient's tolerance of                            previous anesthesia was also reviewed. The risks                            and benefits of the procedure and the sedation                            options and risks were discussed with the patient.                            All questions were answered, and informed consent                            was obtained. Prior Anticoagulants: The patient has                            taken no previous anticoagulant or antiplatelet                            agents. ASA Grade Assessment: II - A patient with                            mild systemic disease. After reviewing the risks                            and benefits, the patient was deemed in                            satisfactory condition to undergo the procedure.                           After obtaining informed consent, the endoscope was  passed under direct vision. Throughout the                            procedure, the patient's blood pressure, pulse, and                            oxygen saturations were monitored continuously. The                            Model GIF-HQ190 [id number]) scope was introduced                            through the mouth, and advanced to the second part                            of duodenum. The upper GI  endoscopy was                            accomplished without difficulty. The patient                            tolerated the procedure well. Scope In: Scope Out: Findings:                 The examined esophagus was normal.                           Diffuse moderate inflammation characterized by                            erythema and friability was found at the incisura                            and in the gastric antrum. Multiple biopsies were                            obtained with cold forceps for histology and                            Helicobacter pylori testing at the incisura, on the                            greater curvature of the gastric antrum and on the                            lesser curvature of the gastric antrum.                           The cardia, gastric fundus and gastric body were                            normal. Multiple biopsies were obtained with cold  forceps for histology and Helicobacter pylori                            testing in the gastric body.                           The examined duodenum was normal. Complications:            No immediate complications. Estimated Blood Loss:     Estimated blood loss was minimal. Impression:               - Normal cardia, gastric fundus and gastric body.                            Multiple biopsies.                           - Normal esophagus.                           - Atrophic gastritis. Multiple biopsies.                           - Normal examined duodenum. Recommendation:           - Patient has a contact number available for                            emergencies. The signs and symptoms of potential                            delayed complications were discussed with the                            patient. Return to normal activities tomorrow.                            Written discharge instructions were provided to the                            patient.                            - Resume previous diet.                           - Continue present medications.                           - Await pathology results. Jerene Bears, MD 03/21/2017 4:35:34 PM This report has been signed electronically.

## 2017-03-21 NOTE — Op Note (Signed)
[facility] Patient Name: Margaretann Abate Procedure Date: 03/21/2017 4:03 PM MRN: [id number] Endoscopist: Jerene Bears , MD Age: 66 Referring MD:  Date of Birth: 1951/06/13 Gender: Female Account #: [id number] Procedure:                Colonoscopy Indications:              Screening in patient at increased risk: Family                            history of 1st-degree relative with colorectal                            cancer, This is the patient's first colonoscopy Medicines:                Monitored Anesthesia Care Procedure:                Pre-Anesthesia Assessment:                           - Prior to the procedure, a History and Physical                            was performed, and patient medications and                            allergies were reviewed. The patient's tolerance of                            previous anesthesia was also reviewed. The risks                            and benefits of the procedure and the sedation                            options and risks were discussed with the patient.                            All questions were answered, and informed consent                            was obtained. Prior Anticoagulants: The patient has                            taken no previous anticoagulant or antiplatelet                            agents. ASA Grade Assessment: II - A patient with                            mild systemic disease. After reviewing the risks                            and benefits, the patient was deemed in  satisfactory condition to undergo the procedure.                           After obtaining informed consent, the colonoscope                            was passed under direct vision. Throughout the                            procedure, the patient's blood pressure, pulse, and                            oxygen saturations were monitored continuously. The                            Model PCF-[id number]  [id number]) scope was introduced                            through the anus and advanced to the the cecum,                            identified by appendiceal orifice and ileocecal                            valve. The colonoscopy was performed without                            difficulty. The patient tolerated the procedure                            well. The quality of the bowel preparation was                            good. The ileocecal valve, appendiceal orifice, and                            rectum were photographed. Scope In: 4:18:40 PM Scope Out: 4:30:06 PM Scope Withdrawal Time: 0 hours 7 minutes 11 seconds  Total Procedure Duration: 0 hours 11 minutes 26 seconds  Findings:                 The digital rectal exam was normal.                           The entire examined colon appeared normal on direct                            and retroflexion views. Complications:            No immediate complications. Estimated Blood Loss:     Estimated blood loss: none. Impression:               - The entire examined colon is normal on direct and  retroflexion views.                           - No specimens collected. Recommendation:           - Patient has a contact number available for                            emergencies. The signs and symptoms of potential                            delayed complications were discussed with the                            patient. Return to normal activities tomorrow.                            Written discharge instructions were provided to the                            patient.                           - Resume previous diet.                           - Continue present medications.                           - Repeat colonoscopy in 5 years for screening                            purposes. Jerene Bears, MD 03/21/2017 4:39:13 PM This report has been signed electronically.

## 2017-03-21 NOTE — Patient Instructions (Signed)
Discharge instructions given. Normal colon exam. Biopsies taken on endoscopy. Resume previous medications. YOU HAD AN ENDOSCOPIC PROCEDURE TODAY AT [facility]:   Refer to the procedure report that was given to you for any specific questions about what was found during the examination.  If the procedure report does not answer your questions, please call your gastroenterologist to clarify.  If you requested that your care partner not be given the details of your procedure findings, then the procedure report has been included in a sealed envelope for you to review at your convenience later.  YOU SHOULD EXPECT: Some feelings of bloating in the abdomen. Passage of more gas than usual.  Walking can help get rid of the air that was put into your GI tract during the procedure and reduce the bloating. If you had a lower endoscopy (such as a colonoscopy or flexible sigmoidoscopy) you may notice spotting of blood in your stool or on the toilet paper. If you underwent a bowel prep for your procedure, you may not have a normal bowel movement for a few days.  Please Note:  You might notice some irritation and congestion in your nose or some drainage.  This is from the oxygen used during your procedure.  There is no need for concern and it should clear up in a day or so.  SYMPTOMS TO REPORT IMMEDIATELY:   Following lower endoscopy (colonoscopy or flexible sigmoidoscopy):  Excessive amounts of blood in the stool  Significant tenderness or worsening of abdominal pains  Swelling of the abdomen that is new, acute  Fever of 100F or higher   Following upper endoscopy (EGD)  Vomiting of blood or coffee ground material  New chest pain or pain under the shoulder blades  Painful or persistently difficult swallowing  New shortness of breath  Fever of 100F or higher  Black, tarry-looking stools  For urgent or emergent issues, a gastroenterologist can be reached at any hour by calling [phone]  [phone].   DIET:  We do recommend a small meal at first, but then you may proceed to your regular diet.  Drink plenty of fluids but you should avoid alcoholic beverages for 24 hours.  ACTIVITY:  You should plan to take it easy for the rest of today and you should NOT DRIVE or use heavy machinery until tomorrow (because of the sedation medicines used during the test).    FOLLOW UP: Our staff will call the number listed on your records the next business day following your procedure to check on you and address any questions or concerns that you may have regarding the information given to you following your procedure. If we do not reach you, we will leave a message.  However, if you are feeling well and you are not experiencing any problems, there is no need to return our call.  We will assume that you have returned to your regular daily activities without incident.  If any biopsies were taken you will be contacted by phone or by letter within the next 1-3 weeks.  Please call us at [phone] if you have not heard about the biopsies in 3 weeks.    SIGNATURES/CONFIDENTIALITY: You and/or your care partner have signed paperwork which will be entered into your electronic medical record.  These signatures attest to the fact that that the information above on your After Visit Summary has been reviewed and is understood.  Full responsibility of the confidentiality of this discharge information lies with you and/or your  care-partner.

## 2017-03-21 NOTE — Progress Notes (Signed)
Called to room to assist during endoscopic procedure.  Patient ID and intended procedure confirmed with present staff. Received instructions for my participation in the procedure from the performing physician.  

## 2017-03-21 NOTE — Progress Notes (Signed)
Patient complain of bumps on the inside of bottom lip. Instructed to gargle with salt and water. Also may use mouthwash to gargle. No open areas noted. Will inform in the morning during call backs on condition of mouth.

## 2017-03-21 NOTE — Progress Notes (Signed)
Jill Adkins, Interpreter from language resourses.  Pt speaks [place] and [place].  maw

## 2017-03-21 NOTE — Progress Notes (Signed)
A/ox3 pleased with MAC, report to Celia RN 

## 2017-03-22 ENCOUNTER — Telehealth: Payer: Self-pay | Admitting: *Deleted

## 2017-03-22 ENCOUNTER — Encounter: Payer: Self-pay | Admitting: Radiation Oncology

## 2017-03-22 NOTE — Telephone Encounter (Signed)
  Follow up Call-  Call back number 03/21/2017  Post procedure Call Back phone  # [phone] Jill Adkins - pt's daughter  Permission to leave phone message Yes  Some recent data might be hidden     Patient questions:  Do you have a fever, pain , or abdominal swelling? No. Pain Score  0 *  Have you tolerated food without any problems? Yes.    Have you been able to return to your normal activities? Yes.    Do you have any questions about your discharge instructions: Diet   No. Medications  No. Follow up visit  No.  Do you have questions or concerns about your Care? No.  Actions: * If pain score is 4 or above: No action needed, pain <4.

## 2017-03-25 ENCOUNTER — Ambulatory Visit (HOSPITAL_BASED_OUTPATIENT_CLINIC_OR_DEPARTMENT_OTHER): Payer: Self-pay | Admitting: Oncology

## 2017-03-25 VITALS — BP 140/70 | HR 66 | Temp 98.7°F | Resp 17 | Ht 61.0 in | Wt 170.8 lb

## 2017-03-25 DIAGNOSIS — Z170 Estrogen receptor positive status [ER+]: Principal | ICD-10-CM

## 2017-03-25 DIAGNOSIS — D0511 Intraductal carcinoma in situ of right breast: Secondary | ICD-10-CM

## 2017-03-25 DIAGNOSIS — C50311 Malignant neoplasm of lower-inner quadrant of right female breast: Secondary | ICD-10-CM

## 2017-03-25 NOTE — Progress Notes (Signed)
[place]  [phone] [phone]) [phone]     ID: Jill Adkins DOB: December 24, 1950  MR#: [id number]  XTK#:[id number]  Patient Care Team: Alfonse Spruce, FNP as PCP - General (Family Medicine) Magrinat, Virgie Dad, MD as Consulting Physician (Oncology) [name], MD as Consulting Physician (General Surgery) Chauncey Cruel, MD OTHER MD:  CHIEF COMPLAINT: Ductal carcinoma in situ  CURRENT TREATMENT: Letrozole   BREAST CANCER HISTORY: From the original intake note:  The patient had routine screening mammography 10/05/2016 showing abnormal grouped calcifications in the right breast upper outer quadrant. On 10/11/2016 she underwent right diagnostic mammography with tomography. His found the breast density to be category B. In the right breast at the 12:00 position there was an oval asymmetry. There were also grouped calcifications in the right breast at the 7:00 middle depth. Ultrasound was performed the same day. This confirmed a 0.7 cm hypoechoic mass in the right breast upper outer quadrant. The right axilla was benign.  On 10/17/2016 the patient underwent ultrasound-guided biopsy of the 7 mm mass in the right breast and this showed ([id number]) a fibroadenoma, with no evidence of malignancy. 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  Given the multicentric disease, the patient had breast MRI 11/05/2016. There were in addition to the fibroadenoma, 2  hematomas in the inferior right breast one in the inner and one in the outer quadrant. There was significant surrounding enhancement in the inferior half of the right breast some of which was postprocedural. The biopsy clips were 6.5 cm apart. 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   Her subsequent history is as detailed below  INTERVAL HISTORY: Jill Adkins returns today for follow-up of her estrogen receptor positive breast cancer accompanied by her daughter Jill Adkins, who helps translate. Patient continues on letrozole, with excellent tolerance. She denies problems with hot flashes or vaginal dryness. She does not have arthralgias or myalgias related to this medication. She does have some aches and pains here and there but she understands these are related to her year of birth. She obtains a drug at a good price.  REVIEW OF SYSTEMS: She remains normally active for her age although she does not exercise specifically to exercise. A detailed review of systems today was otherwise benign  PAST MEDICAL HISTORY: Past Medical History:  Diagnosis Date  . Arthritis   . Breast cancer ([place])    right breast  . Cataracts, bilateral   . GERD (gastroesophageal reflux disease)   . Glaucoma   . History of anemia   . Hyperlipidemia   . Hypertension   . Seasonal allergies     PAST SURGICAL HISTORY: No past surgical history on file.  FAMILY HISTORY Family History  Problem Relation Age of Onset  . Hypertension Father   . Colon cancer Brother 35  . Esophageal cancer Neg Hx   . Rectal cancer Neg Hx   . Stomach cancer Neg Hx   The  patient's father died at the age of 64, with a history of hypertension; the patient's mother is alive at age 63. The patient had 3 brothers 2 sisters. There is no history of cancer in the family to her knowledge.  GYNECOLOGIC HISTORY:  Menarche age 16, first live birth age 82. The patient  is GX P3. She does not remember when she stopped having periods. She did not use oral contraceptives. She used birth control pills remotely with no complications. No LMP recorded. Patient is postmenopausal.   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 She describes herself is single. She is currently residing with her daughter Jill Adkins in [place]. Jill Adkins works in Oceanographer. Jill Adkins lives in [place] and is a Scientist, clinical (histocompatibility and immunogenetics) and Electrical engineer lives in [place] where she is studying. The patient has 11 grandchildren. The patient attends the local mosque  ADVANCED DIRECTIVES: Not in place   HEALTH MAINTENANCE: Social History  Substance Use Topics  . Smoking status: Never Smoker  . Smokeless tobacco: Never Used  . Alcohol use No     Colonoscopy:Never  PAP:  Bone density: Never   No Known Allergies  Current Outpatient Prescriptions  Medication Sig Dispense Refill  . acetaminophen (TYLENOL) 500 MG tablet Take 2 tablets (1,000 mg total) by mouth every 6 (six) hours as needed for headache. 60 tablet 0  . amLODipine (NORVASC) 5 MG tablet Take 1 tablet (5 mg total) by mouth daily. 90 tablet 0  . diphenhydrAMINE (BENADRYL) 25 MG tablet Take 25 mg by mouth every 6 (six) hours as needed.    . famotidine (PEPCID) 40 MG tablet Take 1 tablet (40 mg total) by mouth 2 (two) times daily. 60 tablet 11  . letrozole (FEMARA) 2.5 MG tablet Take 2.5 mg by mouth daily.    Marland Kitchen omeprazole (PRILOSEC) 40 MG capsule Take 40 mg by mouth daily.    . polyethylene glycol powder (GLYCOLAX/MIRALAX) powder Take 1 dose, 17 grams in 8 oz of water as needed for constipation. (Patient not taking: Reported on 03/21/2017) 255 g 3  . rosuvastatin (CRESTOR) 40 MG tablet Take 1 tablet (40 mg total) by mouth daily. 90 tablet 0   Current Facility-Administered Medications  Medication Dose Route Frequency Provider Last Rate Last Dose  . 0.9 %  sodium chloride infusion  500 mL Intravenous Continuous  Pyrtle, Lajuan Lines, MD        OBJECTIVE: Middle-aged African womanWho appears stated age  Vitals:   03/25/17 1348  BP: 140/70  Pulse: 66  Resp: 17  Temp: 98.7 F (37.1 C)     Body mass index is 32.27 kg/m.    ECOG FS:0 - Asymptomatic  Sclerae unicteric, EOMs intact Oropharynx clear and moist No cervical or supraclavicular adenopathy Lungs no rales or rhonchi Heart regular rate and rhythm Abd soft, nontender, positive bowel sounds MSK no focal spinal tenderness, no upper extremity lymphedema Neuro: nonfocal, well oriented, appropriate affect Breasts: I do not palpate a mass in either breast. There are no skin or nipple changes of concern. Both axillae are benign.   LAB RESULTS:  CMP     Component Value Date/Time   NA 142 10/04/2016 1001   K 4.0 10/04/2016 1001   CL 105 10/04/2016 1001   CO2 27 10/04/2016 1001   GLUCOSE 105 (H) 10/04/2016 1001   BUN 12 10/04/2016 1001   CREATININE 0.70 03/06/2017 1721   CREATININE 0.74 10/04/2016 1001   CALCIUM 9.7 10/04/2016 1001  PROT 7.6 10/04/2016 1023   ALBUMIN 4.4 10/04/2016 1023   AST 18 10/04/2016 1023   ALT 12 10/04/2016 1023   ALKPHOS 72 10/04/2016 1023   BILITOT 0.6 10/04/2016 1023    INo results found for: SPEP, UPEP  Lab Results  Component Value Date   WBC 4.0 10/04/2016   NEUTROABS 1,120 (L) 10/04/2016   HGB 13.9 10/04/2016   HCT 42.1 10/04/2016   MCV 81.1 10/04/2016   PLT 349 10/04/2016      Chemistry      Component Value Date/Time   NA 142 10/04/2016 1001   K 4.0 10/04/2016 1001   CL 105 10/04/2016 1001   CO2 27 10/04/2016 1001   BUN 12 10/04/2016 1001   CREATININE 0.70 03/06/2017 1721   CREATININE 0.74 10/04/2016 1001      Component Value Date/Time   CALCIUM 9.7 10/04/2016 1001   ALKPHOS 72 10/04/2016 1023   AST 18 10/04/2016 1023   ALT 12 10/04/2016 1023   BILITOT 0.6 10/04/2016 1023       No results found for: LABCA2  No components found for: LABCA125  No results for input(s): INR in the  last 168 hours.  Urinalysis    Component Value Date/Time   COLORURINE YELLOW 06/06/2009 1613   APPEARANCEUR CLEAR 06/06/2009 1613   LABSPEC 1.020 06/06/2009 1613   PHURINE 6.5 06/06/2009 1613   GLUCOSEU NEGATIVE 06/06/2009 1613   HGBUR NEGATIVE 06/06/2009 1613   BILIRUBINUR NEGATIVE 06/06/2009 1613   KETONESUR NEGATIVE 06/06/2009 1613   PROTEINUR NEGATIVE 06/06/2009 1613   UROBILINOGEN 0.2 06/06/2009 1613   NITRITE NEGATIVE 06/06/2009 1613   LEUKOCYTESUR  06/06/2009 1613    NEGATIVE MICROSCOPIC NOT DONE ON URINES WITH NEGATIVE PROTEIN, BLOOD, LEUKOCYTES, NITRITE, OR GLUCOSE <1000 mg/dL.     STUDIES: Ct Head Wo Contrast  Result Date: 03/11/2017 CLINICAL DATA:  Headache for 1 year. EXAM: CT HEAD WITHOUT CONTRAST TECHNIQUE: Contiguous axial images were obtained from the base of the skull through the vertex without intravenous contrast. COMPARISON:  None. FINDINGS: Brain: No acute intracranial abnormality. Specifically, no hemorrhage, hydrocephalus, mass lesion, acute infarction, or significant intracranial injury. Vascular: No hyperdense vessel or unexpected calcification. Skull: Old left medial orbital wall blowout fracture. No acute calvarial abnormality. Sinuses/Orbits: Mucosal thickening in the ethmoid air cells and right maxillary sinus. No air-fluid levels. Mastoid air cells are clear. Other: None IMPRESSION: No acute intracranial abnormality. Electronically Signed   By: Rolm Baptise M.D.   On: 03/11/2017 12:43   Mr Breast Bilateral W Wo Contrast  Result Date: 03/07/2017 CLINICAL DATA:  Patient with prior diagnosis of right breast ductal carcinoma in situ, two sites. Additionally patient had diagnosis of fibroadenoma within the superior aspect of the right breast. Benign MRI guided core needle biopsy anterior left breast. Patient was started on letrozole on 11/07/2016 as she was planning travel prior to definitive surgical therapy. This serves as follow-up MRI evaluation given patient  EXAM: BILATERAL BREAST MRI WITH AND WITHOUT CONTRAST TECHNIQUE: Multiplanar, multisequence MR images of both breasts were obtained prior to and following the intravenous administration of 14 ml of MultiHance. THREE-DIMENSIONAL MR IMAGE RENDERING ON INDEPENDENT WORKSTATION: Three-dimensional MR images were rendered by post-processing of the original MR data on an independent workstation. The three-dimensional MR images were interpreted, and findings are reported in the following complete MRI report for this study. Three dimensional images were evaluated at the independent DynaCad workstation COMPARISON:  Previous exam(s). FINDINGS: Breast composition: b. Scattered fibroglandular tissue. Background parenchymal enhancement:  Mild Right breast: Within the superior aspect of the right breast there is a small enhancing mass adjacent to susceptibility artifact compatible with biopsy marking clip. This mass represents previously biopsied fibroadenoma. Within the lower outer right breast anterior depth there is susceptibility artifact compatible with biopsy marking clip. No residual abnormal surrounding enhancement. Within the lower inner right breast there is susceptibility artifact compatible with biopsy marking clip. No residual surrounding enhancement. Left breast: Biopsy marking clip is demonstrated within the inferior subareolar left breast. No abnormal enhancement identified within the left breast. Lymph nodes: No abnormal appearing lymph nodes. Ancillary findings:  None. IMPRESSION: Susceptibility artifact compatible with biopsy marking clips within the lower inner and lower outer right breast at the site of previously biopsied ductal carcinoma in situ. No residual abnormal enhancement identified within the inferior aspect of the right breast. No additional suspicious areas of enhancement identified within either breast. RECOMMENDATION: Recommend treatment plan for known two sites of DCIS within the right breast.  BI-RADS CATEGORY  6: Known biopsy-proven malignancy. Electronically Signed   By: Lovey Newcomer M.D.   On: 03/07/2017 09:44    ELIGIBLE FOR AVAILABLE RESEARCH PROTOCOL: No  ASSESSMENT: 66 y.o. [place] speaker originally from [place] status post right breast upper outer and lower outer quadrant biopsies 10/17/2016 and 10/29/2016, both showing ductal carcinoma in situ, grade 2, strongly estrogen and progesterone receptor positive.  (a) the patient opted to postpone definitive surgery for travel reasons  (1) started letrozole 11/07/2016  (2) repeat MRI prior to definitive surgery with consideration of MRI guided biopsy of the left breast suspicious area previously noted  (3) definitive surgery scheduled for 04/11/2017  (4) to meet with radiation oncology 05/02/2017  (5) to continue anti-estrogens for a total of 5 years  PLAN: Mahalie continues on letrozole, with excellent tolerance and she has had a wonderful response to it as measured by her recent breast MRI.  In fact she was to know why she needs surgery. I explained to her that even if we don't see cancer radiologically it can certainly be there microscopically and the only way to find out is to actually remove the portion where the cancer was.  Similarly we discussed why she will need radiation after the cancer is removed, and she understands that without radiation the risk of local recurrence would be much greater.  We discussed the possible side effects and complications of adjuvant radiation and at this point she is willing to undergo it.  However she really wants to get back to [place] as soon as possible. I anticipate she will be receiving radiation through August. Accordingly she will be returning to [place] in September. She is not planning to return for a year and accordingly I have tentatively made her a return appointment with me for a year from now.  The overall plan of course is to continue letrozole for total of 5 years  She knows to  call for any problems that may develop before the next visit here.   Chauncey Cruel, MD   03/25/2017 1:59 PM Medical Oncology and Hematology [facility] [address] [place],  [address] Tel. [phone]    Fax. [phone]

## 2017-03-26 ENCOUNTER — Encounter: Payer: Self-pay | Admitting: Internal Medicine

## 2017-04-02 ENCOUNTER — Telehealth: Payer: Self-pay | Admitting: Internal Medicine

## 2017-04-02 ENCOUNTER — Encounter: Payer: No Typology Code available for payment source | Admitting: Internal Medicine

## 2017-04-02 ENCOUNTER — Other Ambulatory Visit: Payer: Self-pay | Admitting: *Deleted

## 2017-04-02 MED ORDER — LETROZOLE 2.5 MG PO TABS: 2.5000 mg | ORAL_TABLET | Freq: Every day | ORAL | 3 refills | Status: DC

## 2017-04-02 MED FILL — LETROZOLE 2.5 MG TABLET: 2.5 | 30 days supply | Qty: 30 | Fill #0

## 2017-04-02 NOTE — Telephone Encounter (Signed)
Discussed with pts daughter that she does not need and antibiotic for inflammation. Antibiotics were used for infection, not inflammation. Pts daughter aware.

## 2017-04-03 NOTE — Pre-Procedure Instructions (Signed)
Jill Adkins  04/03/2017      [facility] [place] [address] [place] [address] Phone: [phone] Fax: [phone]    Your procedure is scheduled on July 5  Report to [facility] at 0930 A.M.  Call this number if you have problems the morning of surgery:  [phone]   Remember:  Do not eat food or drink liquids after midnight.   Take these medicines the morning of surgery with A SIP OF WATER amLODipine (NORVASC), famotidine (PEPCID), letrozole [facility])  7 days prior to surgery STOP taking any Aspirin, Aleve, Naproxen, Ibuprofen, Motrin, Advil, Goody's, BC's, all herbal medications, fish oil, and all vitamins    Do not wear jewelry, make-up or nail polish.  Do not wear lotions, powders, or perfumes, or deoderant.  Do not shave 48 hours prior to surgery.    Do not bring valuables to the hospital.  [facility] is not responsible for any belongings or valuables.  Contacts, dentures or bridgework may not be worn into surgery.  Leave your suitcase in the car.  After surgery it may be brought to your room.  For patients admitted to the hospital, discharge time will be determined by your treatment team.  Patients discharged the day of surgery will not be allowed to drive home.    Special instructions:   [place] [facility]- Preparing For Surgery  Before surgery, you can play an important role. Because skin is not sterile, your skin needs to be as free of germs as possible. You can reduce the number of germs on your skin by washing with CHG (chlorahexidine gluconate) Soap before surgery.  CHG is an antiseptic cleaner which kills germs and bonds with the skin to continue killing germs even after washing.  Please do not use if you have an allergy to CHG or antibacterial soaps. If your skin becomes reddened/irritated stop using the CHG.  Do not shave (including legs and underarms) for at least 48 hours prior to  first CHG shower. It is OK to shave your face.  Please follow these instructions carefully.   1. Shower the NIGHT BEFORE SURGERY and the MORNING OF SURGERY with CHG.   2. If you chose to wash your hair, wash your hair first as usual with your normal shampoo.  3. After you shampoo, rinse your hair and body thoroughly to remove the shampoo.  4. Use CHG as you would any other liquid soap. You can apply CHG directly to the skin and wash gently with a scrungie or a clean washcloth.   5. Apply the CHG Soap to your body ONLY FROM THE NECK DOWN.  Do not use on open wounds or open sores. Avoid contact with your eyes, ears, mouth and genitals (private parts). Wash genitals (private parts) with your normal soap.  6. Wash thoroughly, paying special attention to the area where your surgery will be performed.  7. Thoroughly rinse your body with warm water from the neck down.  8. DO NOT shower/wash with your normal soap after using and rinsing off the CHG Soap.  9. Pat yourself dry with a CLEAN TOWEL.   10. Wear CLEAN PAJAMAS   11. Place CLEAN SHEETS on your bed the night of your first shower and DO NOT SLEEP WITH PETS.    Day of Surgery: Do not apply any deodorants/lotions. Please wear clean clothes to the hospital/surgery center.      Please read  over the following fact sheets that you were given.

## 2017-04-04 ENCOUNTER — Encounter (HOSPITAL_COMMUNITY): Payer: Self-pay

## 2017-04-04 ENCOUNTER — Encounter (HOSPITAL_COMMUNITY)
Admission: RE | Admit: 2017-04-04 | Discharge: 2017-04-04 | Disposition: A | Discharge status: Home / Self Care | Payer: Self-pay | Source: Ambulatory Visit | Attending: Surgery | Admitting: Surgery

## 2017-04-04 DIAGNOSIS — C50311 Malignant neoplasm of lower-inner quadrant of right female breast: Secondary | ICD-10-CM | POA: Insufficient documentation

## 2017-04-04 DIAGNOSIS — D649 Anemia, unspecified: Secondary | ICD-10-CM | POA: Insufficient documentation

## 2017-04-04 DIAGNOSIS — I10 Essential (primary) hypertension: Secondary | ICD-10-CM | POA: Insufficient documentation

## 2017-04-04 DIAGNOSIS — Z170 Estrogen receptor positive status [ER+]: Secondary | ICD-10-CM | POA: Insufficient documentation

## 2017-04-04 DIAGNOSIS — Z01810 Encounter for preprocedural cardiovascular examination: Secondary | ICD-10-CM | POA: Insufficient documentation

## 2017-04-04 DIAGNOSIS — E785 Hyperlipidemia, unspecified: Secondary | ICD-10-CM | POA: Insufficient documentation

## 2017-04-04 DIAGNOSIS — Z01812 Encounter for preprocedural laboratory examination: Secondary | ICD-10-CM | POA: Insufficient documentation

## 2017-04-04 HISTORY — DX: Headache: R51

## 2017-04-04 HISTORY — DX: Headache, unspecified: R51.9

## 2017-04-04 HISTORY — DX: Anemia, unspecified: D64.9

## 2017-04-04 LAB — BASIC METABOLIC PANEL
Anion gap: 6 (ref 5–15)
BUN: 6 mg/dL (ref 6–20)
CALCIUM: 9.6 mg/dL (ref 8.9–10.3)
CO2: 27 mmol/L (ref 22–32)
CREATININE: 0.63 mg/dL (ref 0.44–1.00)
Chloride: 108 mmol/L (ref 101–111)
GFR calc Af Amer: >60 mL/min (ref >60)
GLUCOSE: 96 mg/dL (ref 65–99)
Potassium: 3.8 mmol/L (ref 3.5–5.1)
Sodium: 141 mmol/L (ref 135–145)

## 2017-04-04 LAB — CBC
HEMATOCRIT: 39.7 % (ref 36.0–46.0)
Hemoglobin: 13 g/dL (ref 12.0–15.0)
MCH: 27.1 pg (ref 26.0–34.0)
MCHC: 32.7 g/dL (ref 30.0–36.0)
MCV: 82.9 fL (ref 78.0–100.0)
PLATELETS: 281 10*3/uL (ref 150–400)
RBC: 4.79 MIL/uL (ref 3.87–5.11)
RDW: 13.8 % (ref 11.5–15.5)
WBC: 4.6 10*3/uL (ref 4.0–10.5)

## 2017-04-04 NOTE — Progress Notes (Signed)
PCP is Dr. Pura Spice 02/2017 Oncologist is Dr. Jana Hakim   Denies any cardiac issues or problem.  No tests, no visits to cardiologist. Daughter has agreed to interpret.  I have asked several times if one is needed.

## 2017-04-05 ENCOUNTER — Ambulatory Visit: Payer: Self-pay | Attending: Family Medicine

## 2017-04-10 NOTE — H&P (Signed)
Jill Adkins 03/19/2017 9:06 AM Location: [facility] Patient #: [id number] DOB: 12-30-1950 Widowed / Language: Cleophus Molt / Race: Refused to Report/Unreported Female   History of Present Illness (Anwyn Kriegel A. Ninfa Linden MD; 03/19/2017 9:17 AM) Patient words: R breast ca f/u.  The patient is a 66 year old female who presents with breast cancer. 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 I saw her in January after she had 2 areas identified with biopsy in the inner lower and outer lower right breast. She has since had MRI biopsy of a lesion in the left breast which was benign. [place] was out [place] and is now returned. She has been on anti-hormonal therapy. She is now ready for definitive surgery regarding her right breast DCIS  Past Surgical History No pertinent past surgical history   Diagnostic Studies History Nance Pear, CMA;  Colonoscopy  never  Allergies Nance Pear, CMA;  No Known Drug Allergies  Allergies Reconciled   Medication History (Sade Bradford, CMA;  AM) AmLODIPine Besylate (5MG  Tablet, Oral daily) Active. Omeprazole (20MG  Capsule DR, Oral daily) Active. Montelukast Sodium (10MG  Tablet, Oral daily) Active. Rosuvastatin Calcium (10MG  Tablet, Oral daily) Active. RaNITidine HCl (150MG  Tablet, Oral daily) Active. Medications Reconciled  Social History ([place], [place]; Tobacco use  Never smoker.  Family History ([place], [place]; First Degree Relatives  No pertinent family history   Pregnancy / Birth History Nance Pear, CMA  Irregular periods   Other Problems (Sade Allensworth, CMA;AM) Arthritis  Back Pain  High blood pressure     Review of Systems Bary [place] CMA  General Not Present- Appetite Loss, Chills, Fatigue, Fever, Night Sweats, Weight Gain and Weight Loss. Skin Not Present- Change in Wart/Mole, Dryness, Hives, Jaundice, New Lesions, Non-Healing Wounds, Rash and  Ulcer. HEENT Present- Earache and Seasonal Allergies. Not Present- Hearing Loss, Hoarseness, Nose Bleed, Oral Ulcers, Ringing in the Ears, Sinus Pain, Sore Throat, Visual Disturbances, Wears glasses/contact lenses and Yellow Eyes. Respiratory Not Present- Bloody sputum, Chronic Cough, Difficulty Breathing, Snoring and Wheezing. Breast Not Present- Breast Mass, Breast Pain, Nipple Discharge and Skin Changes. Cardiovascular Not Present- Chest Pain, Difficulty Breathing Lying Down, Leg Cramps, Palpitations, Rapid Heart Rate, Shortness of Breath and Swelling of Extremities. Gastrointestinal Present- Constipation. Not Present- Abdominal Pain, Bloating, Bloody Stool, Change in Bowel Habits, Chronic diarrhea, Difficulty Swallowing, Excessive gas, Gets full quickly at meals, Hemorrhoids, Indigestion, Nausea, Rectal Pain and Vomiting. Female Genitourinary Not Present- Frequency, Nocturia, Painful Urination, Pelvic Pain and Urgency. Musculoskeletal Present- Back Pain. Not Present- Joint Pain, Joint Stiffness, Muscle Pain, Muscle Weakness and Swelling of Extremities. Neurological Not Present- Decreased Memory, Fainting, Headaches, Numbness, Seizures, Tingling, Tremor, Trouble walking and Weakness. Psychiatric Not Present- Anxiety, Bipolar, Change in Sleep Pattern, Depression, Fearful and Frequent crying. Endocrine Present- Cold Intolerance. Not Present- Excessive Hunger, Hair Changes, Heat Intolerance, Hot flashes and New Diabetes. Hematology Not Present- Blood Thinners, Easy Bruising, Excessive bleeding, Gland problems, HIV and Persistent Infections. Allergies Malachi Bonds, CMA; 03/19/2017 9:07 AM) No Known Drug Allergies 10/31/2016  Medication History (Chemira Jones, CMA; 03/19/2017 9:07 AM) AmLODIPine Besylate (5MG  Tablet, Oral daily) Active. Omeprazole (20MG  Capsule DR, Oral daily) Active. Montelukast Sodium (10MG  Tablet, Oral daily) Active. RaNITidine HCl (150MG  Tablet, Oral daily)  Active. Rosuvastatin Calcium (40MG  Tablet, Oral) Active. Medications Reconciled  Vitals (Chemira Jones CMA; 03/19/2017 9:07 AM) 03/19/2017 9:06 AM Weight: 174.2 lb Height: 59.5in Body Surface Area: 1.75 m Body Mass Index: 34.59 kg/m  Temp.: 98.17F(Oral)  Pulse:  66 (Regular)        Physical Exam (Latanja Lehenbauer A. Ninfa Linden MD; 03/19/2017 9:17 AM) The physical exam findings are as follows: Note:There are no palpable abnormalities in the right breast General Mental Status-Alert. General Appearance-Consistent with stated age. Hydration-Well hydrated. Voice-Normal.  Head and Neck Head-normocephalic, atraumatic with no lesions or palpable masses. Trachea-midline. Thyroid Gland Characteristics - normal size and consistency.  Eye Eyeball - Bilateral-Extraocular movements intact. Sclera/Conjunctiva - Bilateral-No scleral icterus.  Chest and Lung Exam Chest and lung exam reveals -quiet, even and easy respiratory effort with no use of accessory muscles and on auscultation, normal breath sounds, no adventitious sounds and normal vocal resonance. Inspection Chest Wall - Normal. Back - normal.  Breast Breast - Left-Symmetric, Non Tender, No Biopsy scars, no Dimpling, No Inflammation, No Lumpectomy scars, No Mastectomy scars, No Peau d' Orange. Breast - Right-Symmetric, Non Tender, No Biopsy scars, no Dimpling, No Inflammation, No Lumpectomy scars, No Mastectomy scars, No Peau d' Orange. Note: There is ecchymosis and hematoma of the right breast in the lower outer quadrant. I can palpate no other masses and there is no adenopathy on either side. There is no left breast mass   Cardiovascular Cardiovascular examination reveals -normal heart sounds, regular rate and rhythm with no murmurs and normal pedal pulses bilaterally.  Abdomen Inspection Inspection of the abdomen reveals - No Hernias. Skin - Scar - no surgical  scars. Palpation/Percussion Palpation and Percussion of the abdomen reveal - Soft, Non Tender, No Rebound tenderness, No Rigidity (guarding) and No hepatosplenomegaly. Auscultation Auscultation of the abdomen reveals - Bowel sounds normal.  Neurologic - Did not examine.  Musculoskeletal Normal Exam - Left-Upper Extremity Strength Normal and Lower Extremity Strength Normal. Normal Exam - Right-Upper Extremity Strength Normal and Lower Extremity Strength Normal.  Lymphatic Head & Neck  General Head & Neck Lymphatics: Bilateral - Description - Normal. Axillary  General Axillary Region: Bilateral - Description - Normal. Tenderness - Non Tender. Femoral & Inguinal  Generalized Femoral & Inguinal Lymphatics: Bilateral - Description - Normal. Tenderness - Non Tender.    Assessment & Plan (Filip Luten A. 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 After discussion of lumpectomy versus mastectomy and she wishes to proceed with lumpectomy. I would do this with 2 separate radioactive seeds for localization. I discussed the surgical procedure with her in detail through an interpreter. I discussed the risk of surgery which includes but is not limited to bleeding, infection, need for further surgery if margins are positive, postoperative recovery, etc. She understands and wishes to proceed with surgery which will be scheduled

## 2017-04-11 ENCOUNTER — Ambulatory Visit (HOSPITAL_COMMUNITY): Payer: Self-pay | Admitting: Certified Registered Nurse Anesthetist

## 2017-04-11 ENCOUNTER — Encounter (HOSPITAL_COMMUNITY): Payer: Self-pay | Admitting: Anesthesiology

## 2017-04-11 ENCOUNTER — Other Ambulatory Visit: Payer: Self-pay | Admitting: Surgery

## 2017-04-11 ENCOUNTER — Encounter (HOSPITAL_COMMUNITY)
Admission: RE | Disposition: A | Discharge status: Home / Self Care | Payer: Self-pay | Source: Ambulatory Visit | Attending: Surgery

## 2017-04-11 ENCOUNTER — Ambulatory Visit (HOSPITAL_COMMUNITY)
Admission: RE | Admit: 2017-04-11 | Discharge: 2017-04-11 | Disposition: A | Discharge status: Home / Self Care | Payer: Self-pay | Source: Ambulatory Visit | Attending: Surgery | Admitting: Surgery

## 2017-04-11 DIAGNOSIS — Z79899 Other long term (current) drug therapy: Secondary | ICD-10-CM | POA: Insufficient documentation

## 2017-04-11 DIAGNOSIS — Z79890 Hormone replacement therapy (postmenopausal): Secondary | ICD-10-CM | POA: Insufficient documentation

## 2017-04-11 DIAGNOSIS — D0511 Intraductal carcinoma in situ of right breast: Secondary | ICD-10-CM

## 2017-04-11 DIAGNOSIS — N6081 Other benign mammary dysplasias of right breast: Secondary | ICD-10-CM | POA: Insufficient documentation

## 2017-04-11 DIAGNOSIS — N6091 Unspecified benign mammary dysplasia of right breast: Secondary | ICD-10-CM | POA: Insufficient documentation

## 2017-04-11 DIAGNOSIS — I10 Essential (primary) hypertension: Secondary | ICD-10-CM | POA: Insufficient documentation

## 2017-04-11 DIAGNOSIS — K219 Gastro-esophageal reflux disease without esophagitis: Secondary | ICD-10-CM | POA: Insufficient documentation

## 2017-04-11 HISTORY — PX: BREAST LUMPECTOMY WITH RADIOACTIVE SEED LOCALIZATION: SHX6424

## 2017-04-11 SURGERY — BREAST LUMPECTOMY WITH RADIOACTIVE SEED LOCALIZATION
Anesthesia: General | Site: Breast | Laterality: Right

## 2017-04-11 MED ORDER — OXYCODONE HCL 5 MG PO TABS: 5.0000 mg | ORAL_TABLET | Freq: Four times a day (QID) | ORAL | 0 refills | Status: DC | PRN

## 2017-04-11 MED ORDER — ACETAMINOPHEN 500 MG PO TABS
1000.0000 mg | ORAL_TABLET | ORAL | Status: AC
  Administered 2017-04-11: 1000 mg via ORAL

## 2017-04-11 MED ORDER — CHLORHEXIDINE GLUCONATE CLOTH 2 % EX PADS: 6.0000 | MEDICATED_PAD | Freq: Once | CUTANEOUS | Status: DC

## 2017-04-11 MED ORDER — LACTATED RINGERS IV SOLN
INTRAVENOUS | Status: DC
  Administered 2017-04-11: 50 mL/h via INTRAVENOUS
  Administered 2017-04-11: 11:00:00 via INTRAVENOUS

## 2017-04-11 MED ORDER — LIDOCAINE HCL (CARDIAC) 20 MG/ML IV SOLN
INTRAVENOUS | Status: DC | PRN
  Administered 2017-04-11: 60 mg via INTRAVENOUS

## 2017-04-11 MED ORDER — OXYCODONE HCL 5 MG PO TABS: 5.0000 mg | ORAL_TABLET | ORAL | Status: DC | PRN

## 2017-04-11 MED ORDER — 0.9 % SODIUM CHLORIDE (POUR BTL) OPTIME
TOPICAL | Status: DC | PRN
  Administered 2017-04-11: 1000 mL

## 2017-04-11 MED ORDER — ONDANSETRON HCL 4 MG/2ML IJ SOLN
INTRAMUSCULAR | Status: DC | PRN
  Administered 2017-04-11: 4 mg via INTRAVENOUS

## 2017-04-11 MED ORDER — MIDAZOLAM HCL 5 MG/5ML IJ SOLN
INTRAMUSCULAR | Status: DC | PRN
  Administered 2017-04-11: 2 mg via INTRAVENOUS

## 2017-04-11 MED ORDER — CEFAZOLIN SODIUM-DEXTROSE 2-4 GM/100ML-% IV SOLN
2.0000 g | INTRAVENOUS | Status: AC
  Administered 2017-04-11: 2 g via INTRAVENOUS

## 2017-04-11 MED ORDER — FENTANYL CITRATE (PF) 250 MCG/5ML IJ SOLN
INTRAMUSCULAR | Status: AC
  Filled 2017-04-11: qty 5

## 2017-04-11 MED ORDER — BUPIVACAINE-EPINEPHRINE (PF) 0.25% -1:200000 IJ SOLN
INTRAMUSCULAR | Status: AC
  Filled 2017-04-11: qty 30

## 2017-04-11 MED ORDER — CEFAZOLIN SODIUM-DEXTROSE 2-4 GM/100ML-% IV SOLN
INTRAVENOUS | Status: AC
  Filled 2017-04-11: qty 100

## 2017-04-11 MED ORDER — MIDAZOLAM HCL 2 MG/2ML IJ SOLN
INTRAMUSCULAR | Status: AC
  Filled 2017-04-11: qty 2

## 2017-04-11 MED ORDER — PHENYLEPHRINE HCL 10 MG/ML IJ SOLN
INTRAMUSCULAR | Status: DC | PRN
  Administered 2017-04-11 (×3): 80 ug via INTRAVENOUS

## 2017-04-11 MED ORDER — BUPIVACAINE-EPINEPHRINE 0.25% -1:200000 IJ SOLN
INTRAMUSCULAR | Status: DC | PRN
  Administered 2017-04-11: 20 mL

## 2017-04-11 MED ORDER — ACETAMINOPHEN 500 MG PO TABS
ORAL_TABLET | ORAL | Status: AC
  Filled 2017-04-11: qty 2

## 2017-04-11 MED ORDER — DEXAMETHASONE SODIUM PHOSPHATE 4 MG/ML IJ SOLN
INTRAMUSCULAR | Status: DC | PRN
  Administered 2017-04-11: 10 mg via INTRAVENOUS

## 2017-04-11 MED ORDER — PROPOFOL 10 MG/ML IV BOLUS
INTRAVENOUS | Status: DC | PRN
  Administered 2017-04-11: 150 mg via INTRAVENOUS

## 2017-04-11 MED ORDER — FENTANYL CITRATE (PF) 100 MCG/2ML IJ SOLN
INTRAMUSCULAR | Status: DC | PRN
  Administered 2017-04-11: 50 ug via INTRAVENOUS
  Administered 2017-04-11: 100 ug via INTRAVENOUS

## 2017-04-11 MED FILL — AMLODIPINE BESYLATE 5 MG TA: 5 | 30 days supply | Qty: 30 | Fill #1

## 2017-04-11 MED FILL — ROSUVASTATIN CALCIUM 40 MG: 40 | 30 days supply | Qty: 30 | Fill #1

## 2017-04-11 SURGICAL SUPPLY — 44 items
APPLIER CLIP 9.375 MED OPEN (MISCELLANEOUS) ×3
BINDER BREAST LRG (GAUZE/BANDAGES/DRESSINGS) ×3 IMPLANT
BINDER BREAST XLRG (GAUZE/BANDAGES/DRESSINGS) IMPLANT
BLADE SURG 15 STRL LF DISP TIS (BLADE) ×1 IMPLANT
BLADE SURG 15 STRL SS (BLADE) ×2
CANISTER SUCT 3000ML PPV (MISCELLANEOUS) ×3 IMPLANT
CHLORAPREP W/TINT 26ML (MISCELLANEOUS) ×3 IMPLANT
CLIP APPLIE 9.375 MED OPEN (MISCELLANEOUS) ×1 IMPLANT
CONT SPEC 4OZ CLIKSEAL STRL BL (MISCELLANEOUS) ×6 IMPLANT
COVER PROBE W GEL 5X96 (DRAPES) ×3 IMPLANT
COVER SURGICAL LIGHT HANDLE (MISCELLANEOUS) ×3 IMPLANT
DERMABOND ADVANCED (GAUZE/BANDAGES/DRESSINGS) ×2
DERMABOND ADVANCED .7 DNX12 (GAUZE/BANDAGES/DRESSINGS) ×1 IMPLANT
DEVICE DUBIN SPECIMEN MAMMOGRA (MISCELLANEOUS) ×6 IMPLANT
DRAPE CHEST BREAST 15X10 FENES (DRAPES) ×3 IMPLANT
DRAPE UTILITY XL STRL (DRAPES) IMPLANT
ELECT CAUTERY BLADE 6.4 (BLADE) ×3 IMPLANT
ELECT COATED BLADE 2.86 ST (ELECTRODE) ×3 IMPLANT
ELECT REM PT RETURN 9FT ADLT (ELECTROSURGICAL) ×3
ELECTRODE REM PT RTRN 9FT ADLT (ELECTROSURGICAL) ×1 IMPLANT
GLOVE BIOGEL PI IND STRL 6.5 (GLOVE) ×1 IMPLANT
GLOVE BIOGEL PI INDICATOR 6.5 (GLOVE) ×2
GLOVE SURG SIGNA 7.5 PF LTX (GLOVE) ×3 IMPLANT
GOWN STRL REUS W/ TWL LRG LVL3 (GOWN DISPOSABLE) ×1 IMPLANT
GOWN STRL REUS W/ TWL XL LVL3 (GOWN DISPOSABLE) ×1 IMPLANT
GOWN STRL REUS W/TWL LRG LVL3 (GOWN DISPOSABLE) ×2
GOWN STRL REUS W/TWL XL LVL3 (GOWN DISPOSABLE) ×2
KIT BASIN OR (CUSTOM PROCEDURE TRAY) ×3 IMPLANT
KIT MARKER MARGIN INK (KITS) ×3 IMPLANT
NEEDLE HYPO 25X1 1.5 SAFETY (NEEDLE) ×3 IMPLANT
NS IRRIG 1000ML POUR BTL (IV SOLUTION) IMPLANT
PACK GENERAL/GYN (CUSTOM PROCEDURE TRAY) ×3 IMPLANT
PACK SURGICAL SETUP 50X90 (CUSTOM PROCEDURE TRAY) IMPLANT
PENCIL BUTTON HOLSTER BLD 10FT (ELECTRODE) IMPLANT
SPONGE LAP 18X18 X RAY DECT (DISPOSABLE) IMPLANT
SUT MNCRL AB 4-0 PS2 18 (SUTURE) ×3 IMPLANT
SUT VIC AB 3-0 SH 18 (SUTURE) ×3 IMPLANT
SYR BULB 3OZ (MISCELLANEOUS) ×3 IMPLANT
SYR CONTROL 10ML LL (SYRINGE) ×3 IMPLANT
TOWEL OR 17X24 6PK STRL BLUE (TOWEL DISPOSABLE) ×3 IMPLANT
TOWEL OR 17X26 10 PK STRL BLUE (TOWEL DISPOSABLE) IMPLANT
TUBE CONNECTING 12'X1/4 (SUCTIONS)
TUBE CONNECTING 12X1/4 (SUCTIONS) IMPLANT
YANKAUER SUCT BULB TIP NO VENT (SUCTIONS) IMPLANT

## 2017-04-11 NOTE — Anesthesia Procedure Notes (Signed)
Procedure Name: LMA Insertion Date/Time: 04/11/2017 11:25 AM Performed by: Ollen Bowl Pre-anesthesia Checklist: Patient identified, Emergency Drugs available, Suction available, Patient being monitored and Timeout performed Patient Re-evaluated:Patient Re-evaluated prior to inductionOxygen Delivery Method: Circle system utilized and Simple face mask Preoxygenation: Pre-oxygenation with 100% oxygen Intubation Type: IV induction Ventilation: Mask ventilation without difficulty LMA Size: 5.0 Number of attempts: 1 Airway Equipment and Method: Patient positioned with wedge pillow Placement Confirmation: positive ETCO2 and breath sounds checked- equal and bilateral Tube secured with: Tape Dental Injury: Teeth and Oropharynx as per pre-operative assessment

## 2017-04-11 NOTE — Interval H&P Note (Signed)
History and Physical Interval Note: no change in H and P  04/11/2017 10:28 AM  Jill Adkins  has presented today for surgery, with the diagnosis of RIGHT BREAST DCIS X2  The various methods of treatment have been discussed with the patient and family. After consideration of risks, benefits and other options for treatment, the patient has consented to  Procedure(s): RIGHT BREAST LUMPECTOMY WITH RADIOACTIVE SEED LOCALIZATION X2 (Right) as a surgical intervention .  The patient's history has been reviewed, patient examined, no change in status, stable for surgery.  I have reviewed the patient's chart and labs.  Questions were answered to the patient's satisfaction.     [name]

## 2017-04-11 NOTE — Anesthesia Postprocedure Evaluation (Signed)
Anesthesia Post Note  Patient: Jill Adkins  Procedure(s) Performed: Procedure(s) (LRB): RIGHT BREAST LUMPECTOMY WITH RADIOACTIVE SEED LOCALIZATION X2 (Right)     Patient location during evaluation: PACU Anesthesia Type: General Level of consciousness: awake and alert Pain management: pain level controlled Vital Signs Assessment: post-procedure vital signs reviewed and stable Respiratory status: spontaneous breathing, nonlabored ventilation, respiratory function stable and patient connected to nasal cannula oxygen Cardiovascular status: blood pressure returned to baseline and stable Postop Assessment: no signs of nausea or vomiting Anesthetic complications: no    Last Vitals:  Vitals:   04/11/17 1250 04/11/17 1257  BP:  (!) 142/63  Pulse: 67 62  Resp: (!) 22 18  Temp: (!) 36.3 C     Last Pain:  Vitals:   04/11/17 1216  TempSrc:   PainSc: 0-No pain                 Effie Berkshire

## 2017-04-11 NOTE — Transfer of Care (Signed)
Immediate Anesthesia Transfer of Care Note  Patient: Jill Adkins  Procedure(s) Performed: Procedure(s): RIGHT BREAST LUMPECTOMY WITH RADIOACTIVE SEED LOCALIZATION X2 (Right)  Patient Location: PACU  Anesthesia Type:General  Level of Consciousness: awake, alert  and oriented  Airway & Oxygen Therapy: Patient Spontanous Breathing and Patient connected to nasal cannula oxygen  Post-op Assessment: Report given to RN and Post -op Vital signs reviewed and stable  Post vital signs: Reviewed and stable  Last Vitals:  Vitals:   04/11/17 0916 04/11/17 1216  BP: 127/66 (P) 113/63  Pulse: 73 (P) 68  Resp: 18 (P) 10  Temp: 37.2 C 36.6 C    Last Pain:  Vitals:   04/11/17 0916  TempSrc: Oral      Patients Stated Pain Goal: 3 (32/44/01 0272)  Complications: No apparent anesthesia complications

## 2017-04-11 NOTE — Op Note (Signed)
RIGHT BREAST LUMPECTOMY WITH RADIOACTIVE SEED LOCALIZATION X2  Procedure Note  Jill Adkins 04/11/2017   Pre-op Diagnosis: RIGHT BREAST DCIS X2     Post-op Diagnosis: same  Procedure(s): RIGHT BREAST LUMPECTOMY WITH RADIOACTIVE SEED LOCALIZATION X2  Surgeon(s): [name], MD  Anesthesia: General  Staff:  Circulator: Beryle Lathe, RN; Thacker, Amy J, RN Scrub Person: Thereasa Distance T  Estimated Blood Loss: Minimal               Specimens: Sent to path  Indications: This is Adkins 67 year old female who presented with DCIS in the right breast and 2 separate locations. Both areas were quite small.  After long discussion, she elected to proceed with Adkins lumpectomy of the 2 separate areas.  Findings: The 2 separate lumpectomy specimens were x-rayed and the suspicious areas and previous lipase markers were intact. Each of the radioactive seeds were also identified.  Procedure: The patient was brought to the operating room and attentive as correct patient. She was placed supine on the operating table and general anesthesia was induced. Her right breast was then prepped and draped in the usual sterile fashion. I used the neoprobe to localize both seeds. One was in the medial lower breast on the right side and the other was in the lateral lower breast on the right side. The medial seed was located close to the areola. I anesthetized the skin with Marcaine and made Adkins circumareolar incision with Adkins scalpel. I performed Adkins lumpectomy with the aid of the neoprobe staying widely around the seed.  As I was lifting the breast specimen out the seed came out of breast tissue and was sent separately to pathology. I marked all margins with marker pain. The specimen was x-rayed in the previous biopsy marker located in the specimen. This medial breast lipectomy was then sent to pathology for evaluation. I next identified the seed on the lateral aspect of the breast. This was closer to the inframammary ridge.  I anesthetized skin with Marcaine and made an inframammary incision with Adkins scalpel.  I then again performed Adkins lumpectomy with the aid of the neoprobe staying around the seed. Once again, one elevated specimen out of the incision and the radioactive seed came dislodged and I removed separately. This was then x-rayed and sent to pathology. I then completed lumpectomy. I marked the margins with marker pain. X-ray confirmed that the original marker was in the specimen. Again all specimens were sent to pathology. I achieved hemostasis and both incisions with the cautery. I anesthetized both wounds with Marcaine again. I placed surgical clips in both biopsy cavities. I then closed both incisions with interrupted 3-0 Vicryl sutures and running 4-0 Monocryl sutures. Dermabond was then applied. The patient was then placed in Adkins breast binder. She tolerated the procedure well. All the counts were correct at the end of the procedure. She was then extubated in the operating room and taken in Adkins stable condition to the recovery room.          Jill Adkins   Date: 04/11/2017  Time: 12:08 PM

## 2017-04-11 NOTE — Discharge Instructions (Signed)
[facility] Office Phone Number [phone]  BREAST BIOPSY/ PARTIAL MASTECTOMY: POST OP INSTRUCTIONS  Always review your discharge instruction sheet given to you by the facility where your surgery was performed.  IF YOU HAVE DISABILITY OR FAMILY LEAVE FORMS, YOU MUST BRING THEM TO THE OFFICE FOR PROCESSING.  DO NOT GIVE THEM TO YOUR DOCTOR.  1. A prescription for pain medication may be given to you upon discharge.  Take your pain medication as prescribed, if needed.  If narcotic pain medicine is not needed, then you may take acetaminophen (Tylenol) or ibuprofen (Advil) as needed. 2. Take your usually prescribed medications unless otherwise directed 3. If you need a refill on your pain medication, please contact your pharmacy.  They will contact our office to request authorization.  Prescriptions will not be filled after 5pm or on week-ends. 4. You should eat very light the first 24 hours after surgery, such as soup, crackers, pudding, etc.  Resume your normal diet the day after surgery. 5. Most patients will experience some swelling and bruising in the breast.  Ice packs and a good support bra will help.  Swelling and bruising can take several days to resolve.  6. It is common to experience some constipation if taking pain medication after surgery.  Increasing fluid intake and taking a stool softener will usually help or prevent this problem from occurring.  A mild laxative (Milk of Magnesia or Miralax) should be taken according to package directions if there are no bowel movements after 48 hours. 7. Unless discharge instructions indicate otherwise, you may remove your bandages 24-48 hours after surgery, and you may shower at that time.  You may have steri-strips (small skin tapes) in place directly over the incision.  These strips should be left on the skin for 7-10 days.  If your surgeon used skin glue on the incision, you may shower in 24 hours.  The glue will flake off over the  next 2-3 weeks.  Any sutures or staples will be removed at the office during your follow-up visit. 8. ACTIVITIES:  You may resume regular daily activities (gradually increasing) beginning the next day.  Wearing a good support bra or sports bra minimizes pain and swelling.  You may have sexual intercourse when it is comfortable. a. You may drive when you no longer are taking prescription pain medication, you can comfortably wear a seatbelt, and you can safely maneuver your car and apply brakes. b. RETURN TO WORK:  ______________________________________________________________________________________ 9. You should see your doctor in the office for a follow-up appointment approximately two weeks after your surgery.  Your doctors nurse will typically make your follow-up appointment when she calls you with your pathology report.  Expect your pathology report 2-3 business days after your surgery.  You may call to check if you do not hear from [place] after three days. 10. OTHER INSTRUCTIONS: __OK TO SHOWER STARTING TOMORROW 11. NO SOAKING IN A TUB FOR 1 WEEK 12. ICE PACK AND IBUPROFEN/TYLENOL ALSO FOR PAIN. 13. _____________________________________________________________________________________________ _____________________________________________________________________________________________________________________________________ _____________________________________________________________________________________________________________________________________ _____________________________________________________________________________________________________________________________________  WHEN TO CALL YOUR DOCTOR: 1. Fever over 101.0 2. Nausea and/or vomiting. 3. Extreme swelling or bruising. 4. Continued bleeding from incision. 5. Increased pain, redness, or drainage from the incision.  The clinic staff is available to answer your questions during regular business hours.  Please dont hesitate to  call and ask to speak to one of the nurses for clinical concerns.  If you have a medical emergency, go to the nearest emergency room or call  911.  A surgeon from [facility] is always on call at the hospital.  For further questions, please visit [email or web address]

## 2017-04-11 NOTE — Anesthesia Preprocedure Evaluation (Signed)
Anesthesia Evaluation  Patient identified by MRN, date of birth, ID band Patient awake    Reviewed: Allergy & Precautions, NPO status , Patient's Chart, lab work & pertinent test results  Airway Mallampati: III  TM Distance: >3 FB Neck ROM: Full    Dental  (+) Missing, Dental Advisory Given,    Pulmonary neg pulmonary ROS,    breath sounds clear to auscultation       Cardiovascular hypertension, Pt. on medications  Rhythm:Regular Rate:Normal     Neuro/Psych  Headaches, negative psych ROS   GI/Hepatic Neg liver ROS, GERD  Medicated,  Endo/Other  negative endocrine ROS  Renal/GU negative Renal ROS     Musculoskeletal  (+) Arthritis , Osteoarthritis,    Abdominal Normal abdominal exam  (+)   Peds  Hematology negative hematology ROS (+)   Anesthesia Other Findings - HLD  Reproductive/Obstetrics negative OB ROS                            Lab Results  Component Value Date   WBC 4.6 04/04/2017   HGB 13.0 04/04/2017   HCT 39.7 04/04/2017   MCV 82.9 04/04/2017   PLT 281 04/04/2017   Lab Results  Component Value Date   CREATININE 0.63 04/04/2017   BUN 6 04/04/2017   NA 141 04/04/2017   K 3.8 04/04/2017   CL 108 04/04/2017   CO2 27 04/04/2017   No results found for: INR, PROTIME  EKG: normal sinus rhythm.  Anesthesia Physical Anesthesia Plan  ASA: II  Anesthesia Plan: General   Post-op Pain Management:    Induction: Intravenous  PONV Risk Score and Plan: 4 or greater and Ondansetron, Dexamethasone, Propofol, Midazolam and Scopolamine patch - Pre-op  Airway Management Planned: LMA  Additional Equipment:   Intra-op Plan:   Post-operative Plan: Extubation in OR  Informed Consent: I have reviewed the patients History and Physical, chart, labs and discussed the procedure including the risks, benefits and alternatives for the proposed anesthesia with the patient or authorized  representative who has indicated his/her understanding and acceptance.   Dental advisory given  Plan Discussed with: CRNA  Anesthesia Plan Comments:         Anesthesia Quick Evaluation

## 2017-04-12 ENCOUNTER — Encounter (HOSPITAL_COMMUNITY): Payer: Self-pay | Admitting: Surgery

## 2017-04-19 ENCOUNTER — Encounter (HOSPITAL_COMMUNITY): Payer: Self-pay | Admitting: *Deleted

## 2017-04-24 ENCOUNTER — Encounter: Payer: Self-pay | Admitting: Pharmacist

## 2017-04-29 NOTE — Progress Notes (Signed)
Location of Breast Cancer: Right Breast  Histology per Pathology Report:Diagnosis 10/29/2016 Breast, right, needle core biopsy - DUCTAL CARCINOMA IN SITU WITH CALCIFICATIONS, INTERMEDIATE. - SEE MICROSCOPIC DESCRIPTION Diagnosis 1/10/218 Breast, right, needle core biopsy - DUCTAL CARCINOMA IN SITU WITH CALCIFICATIONS.  Diagnosis 10/17/2016 Breast, right, needle core biopsy - FIBROADENOMA. - THERE IS NO EVIDENCE OF MALIGNANCY  Receptor Status: ER(100%+), PR (95%+), Her2-neu (), Ki-()  Did patient present with symptoms (if so, please note symptoms) or was this found on screening mammography?: screening mammogram  Past/Anticipated interventions by surgeon, if any: 04/11/2017 Procedure: RIGHT BREAST LUMPECTOMY WITH RADIOACTIVE SEED LOCALIZATION X2;  Surgeon: [name], MD;  Location: [place] [facility];  Service: General;  Laterality: Right;  Past/Anticipated interventions by medical oncology, if any: Chemotherapy : Dr. Jana Hakim letrozole for total of 5 years  Lymphedema issues, if any:  No  Pain issues, if any:  NO  SAFETY ISSUES: NO  Prior radiation? NO  Pacemaker/ICD?  NO  Possible current pregnancy? NO  Is the patient on methotrexate? NO  Current Complaints / other details:  G4P3,  Miscarriage, menses age 23, age 74st child age 43,speaks [place] from [place], lives with daughter Jill Adkins.   Interpretor Jill Adkins present today.

## 2017-04-30 ENCOUNTER — Other Ambulatory Visit: Payer: Self-pay | Admitting: *Deleted

## 2017-04-30 MED ORDER — LETROZOLE 2.5 MG PO TABS: 2.5000 mg | ORAL_TABLET | Freq: Every day | ORAL | 12 refills | Status: DC

## 2017-04-30 MED FILL — LETROZOLE 2.5 MG TABLET: 2.5 | 90 days supply | Qty: 90 | Fill #0

## 2017-05-02 ENCOUNTER — Ambulatory Visit: Payer: Self-pay

## 2017-05-02 ENCOUNTER — Encounter: Payer: Self-pay | Admitting: Radiation Oncology

## 2017-05-02 ENCOUNTER — Ambulatory Visit
Admission: RE | Admit: 2017-05-02 | Discharge: 2017-05-02 | Disposition: A | Discharge status: Home / Self Care | Payer: Self-pay | Source: Ambulatory Visit | Attending: Radiation Oncology | Admitting: Radiation Oncology

## 2017-05-02 DIAGNOSIS — Z8249 Family history of ischemic heart disease and other diseases of the circulatory system: Secondary | ICD-10-CM | POA: Insufficient documentation

## 2017-05-02 DIAGNOSIS — E785 Hyperlipidemia, unspecified: Secondary | ICD-10-CM | POA: Insufficient documentation

## 2017-05-02 DIAGNOSIS — C50411 Malignant neoplasm of upper-outer quadrant of right female breast: Secondary | ICD-10-CM

## 2017-05-02 DIAGNOSIS — D0511 Intraductal carcinoma in situ of right breast: Secondary | ICD-10-CM | POA: Insufficient documentation

## 2017-05-02 DIAGNOSIS — K219 Gastro-esophageal reflux disease without esophagitis: Secondary | ICD-10-CM | POA: Insufficient documentation

## 2017-05-02 DIAGNOSIS — I10 Essential (primary) hypertension: Secondary | ICD-10-CM | POA: Insufficient documentation

## 2017-05-02 DIAGNOSIS — Z170 Estrogen receptor positive status [ER+]: Secondary | ICD-10-CM | POA: Insufficient documentation

## 2017-05-02 NOTE — Progress Notes (Addendum)
Radiation Oncology         [phone] ________________________________  Name: Jill Adkins MRN: [id number]  Date: 05/02/2017  DOB: [birth year]  ER:[id number], [name], FNP  [name], MD     REFERRING PHYSICIAN: [name], MD   DIAGNOSIS: The encounter diagnosis was Malignant neoplasm of upper-outer quadrant of right breast in female, estrogen receptor positive ([place]).    HISTORY OF PRESENT ILLNESS: Jill Adkins is a 66 y.o. female who initially presented for screening mammogram on 10/05/16, which showed abnormality in the right breast. She was referred for right breast diagnostic ultrasound on 10/11/16, showing a grouping of calcifications and also revealing a 7 mm mass in the upper outer quadrant with negative axilla. 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 She met with Dr. Ninfa Linden and had an MRI of bilateral breasts on 11/03/16 revealing three lesions corresponding to the biopsy sites, with two large hematomas in the lower quadrants concsistent with recent biopsy. She also had linear enhancement of the left lower outer quadrant approximately 12 mm. No abnormal nodes were noted. She began Letrozole as she was going to be out of the country for several months. When she recently returned to the [place], she underwent an MRI on 03/06/17 of bilateral breasts and this revealed no enhancing abnormalities of either breast. She proceeded with bilateral lumpectomies bilaterally on 04/11/17 and neither revealed invasive or in situ disease. 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 She comes today to discuss the role of radiotherapy, but has plans to leave to go home  to [place] this weekend and will be gon 6-12 months.  PREVIOUS RADIATION THERAPY: No   PAST MEDICAL HISTORY:  Past Medical History:  Diagnosis Date  . Anemia   . Arthritis   . Breast cancer ([place])    right breast  . Cataracts, bilateral   . GERD (gastroesophageal reflux disease)   . Glaucoma   . Headache   . History of anemia   . Hyperlipidemia   . Hypertension   . Seasonal allergies        PAST SURGICAL HISTORY: Past Surgical History:  Procedure Laterality Date  . BREAST LUMPECTOMY WITH RADIOACTIVE SEED LOCALIZATION Right 04/11/2017   Procedure: RIGHT BREAST LUMPECTOMY WITH RADIOACTIVE SEED LOCALIZATION X2;  Surgeon: [name], MD;  Location: [facility];  Service: General;  Laterality: Right;     FAMILY HISTORY:  Family History  Problem Relation Age of Onset  . Hypertension Father   . Colon cancer Brother 9  . Esophageal cancer Neg Hx   . Rectal cancer Neg Hx   . Stomach cancer Neg Hx      SOCIAL HISTORY:  reports that she has never smoked. She has never used smokeless tobacco. She reports that she does not drink alcohol or use drugs. The patient is from [place] and speaks [place]. She has three children.   ALLERGIES: No known allergies   MEDICATIONS:  Current Outpatient Prescriptions  Medication Sig Dispense Refill  . acetaminophen (TYLENOL) 500 MG tablet Take 2 tablets (1,000 mg total) by mouth every 6 (six) hours as needed for headache. 60 tablet 0  . amLODipine (NORVASC) 5 MG tablet Take 1 tablet (5 mg  total) by mouth daily. 90 tablet 0  . diphenhydrAMINE (BENADRYL) 25 MG tablet Take 25 mg by mouth at bedtime.     . famotidine (PEPCID) 40 MG tablet Take 1 tablet (40 mg total) by mouth 2 (two) times daily. 60 tablet 11  . letrozole (FEMARA) 2.5 MG tablet Take 1 tablet (2.5 mg total) by mouth daily. 30 tablet 12  . rosuvastatin (CRESTOR) 40 MG tablet Take 1 tablet (40 mg total) by mouth daily. (Patient taking differently: Take 40 mg by mouth every evening. ) 90  tablet 0  . oxyCODONE (OXY IR/ROXICODONE) 5 MG immediate release tablet Take 1-2 tablets (5-10 mg total) by mouth every 6 (six) hours as needed for severe pain. (Patient not taking: Reported on 05/02/2017) 25 tablet 0   Current Facility-Administered Medications  Medication Dose Route Frequency Provider Last Rate Last Dose  . 0.9 %  sodium chloride infusion  500 mL Intravenous Continuous Pyrtle, Lajuan Lines, MD         REVIEW OF SYSTEMS: On review of systems, the patient reports that she is doing well overall. She denies any chest pain, shortness of breath, cough, fevers, chills, night sweats, unintended weight changes. She reports healing well from surgery. She denies any breast pain or nipple bleeding or discharge. She's taking Letrozole daily. She denies any bowel or bladder disturbances, and denies abdominal pain, nausea or vomiting. She denies any new musculoskeletal or joint aches or pains. She denies any lymphedema issues. A complete review of systems is obtained and is otherwise negative.    PHYSICAL EXAM:  Wt Readings from Last 3 Encounters:  04/11/17 170 lb (77.1 kg)  04/04/17 170 lb 12.8 oz (77.5 kg)  03/25/17 170 lb 12.8 oz (77.5 kg)   Temp Readings from Last 3 Encounters:  04/11/17 (!) 97.3 F (36.3 C)  04/04/17 98.4 F (36.9 C)  03/25/17 98.7 F (37.1 C) (Oral)   BP Readings from Last 3 Encounters:  04/11/17 (!) 142/63  04/04/17 118/61  03/25/17 140/70   Pulse Readings from Last 3 Encounters:  04/11/17 62  04/04/17 70  03/25/17 66   In general this is a well appearing African  female in no acute distress. She is alert and oriented x4 and appropriate throughout the examination. Cardiopulmonary assessment is negative for acute distress and she exhibits normal effort.    ECOG =0  0 - Asymptomatic (Fully active, able to carry on all predisease activities without restriction)  1 - Symptomatic but completely ambulatory (Restricted in physically strenuous activity but  ambulatory and able to carry out work of a light or sedentary nature. For example, light housework, office work)  2 - Symptomatic, <50% in bed during the day (Ambulatory and capable of all self care but unable to carry out any work activities. Up and about more than 50% of waking hours)  3 - Symptomatic, >50% in bed, but not bedbound (Capable of only limited self-care, confined to bed or chair 50% or more of waking hours)  4 - Bedbound (Completely disabled. Cannot carry on any self-care. Totally confined to bed or chair)  5 - Death   Eustace Pen MM, Creech RH, Tormey DC, et al. [phone]). "Toxicity and response criteria of the [facility]". Equality Oncol. 5 (6): 649-55    LABORATORY DATA:  Lab Results  Component Value Date   WBC 4.6 04/04/2017   HGB 13.0 04/04/2017   HCT 39.7 04/04/2017   MCV 82.9 04/04/2017   PLT 281 04/04/2017  Lab Results  Component Value Date   NA 141 04/04/2017   K 3.8 04/04/2017   CL 108 04/04/2017   CO2 27 04/04/2017   Lab Results  Component Value Date   ALT 12 10/04/2016   AST 18 10/04/2016   ALKPHOS 72 10/04/2016   BILITOT 0.6 10/04/2016      RADIOGRAPHY: No results found.     IMPRESSION/PLAN: 1.  ER/PR positive DCIS of the right breast. We met with the patient and her daughter today with the assistance of an interpreter, and Dr. Lisbeth Renshaw  discusses the pathology findings. In her case, he would recommend adjuvant radiotherapy at this time to the right breast, and for long term continuation of her endocrine therapy. We discussed the risks, benefits, short, and long term effects of radiotherapy as well as the delivery and logistics of radiotherapy. We will see her back about 2 weeks after surgery to move forward with the simulation and planning process and anticipate starting radiotherapy about 4 weeks after surgery. The patient is hoping to avoid treatment, and as she will be leaving this week to return to [place], we will not feasibly  be able to offer her treatment before she leaves. She is encouraged to contact us if her plans change, however Dr. Lisbeth Renshaw discusses the limitations of waiting more than 6 months to start treatment, as it is difficult to counsel the patient on outcomes, due to lack of data in treating her that far out from surgery. We will be happy to see her back if she has questions about recommendations or if her plans to leave change.  In a visit lasting 30 minutes, greater than 50% of the time was spent face to face discussing her plans and trying to explain the rationale for treatment, but the obstacles with her plans to return to her home country, and coordinating the patient's care.   The above documentation reflects my direct findings during this shared patient visit. Please see the separate note by Dr. Lisbeth Renshaw on this date for the remainder of the patient's plan of care.    Carola Rhine, PAC  This document serves as a record of services personally performed by Kyung Rudd, MD and Shona Simpson, PA-C. It was created on their behalf by Maryla Morrow, a trained medical scribe. The creation of this record is based on the scribe's personal observations and the provider's statements to them. This document has been checked and approved by the attending provider.

## 2017-05-02 NOTE — Progress Notes (Signed)
Patient would not complete distress screening tool.

## 2017-05-03 ENCOUNTER — Encounter: Payer: Self-pay | Admitting: *Deleted

## 2017-05-03 ENCOUNTER — Encounter: Payer: Self-pay | Admitting: Pharmacist

## 2017-05-03 MED FILL — AMLODIPINE BESYLATE 5 MG TA: 5 | 90 days supply | Qty: 90 | Fill #2

## 2017-05-03 MED FILL — MONTELUKAST SOD 10 MG TAB: 10 | 30 days supply | Qty: 30 | Fill #0

## 2017-05-03 NOTE — Progress Notes (Signed)
Oral Chemotherapy Pharmacist Encounter  Dispensed samples to patient:  Medication: Femara 2.5mg  tablets Instructions: Take 1 tablet (2.5mg ) by mouth once daily Quantity dispensed: 30 Days supply: 30 Manufacturer: [employer] Lot: [id number]  Exp: Sep 2019  Johny Drilling, PharmD, BCPS, BCOP 05/03/2017 3:26 PM [facility] [phone]

## 2017-05-07 ENCOUNTER — Telehealth: Payer: Self-pay | Admitting: *Deleted

## 2017-05-07 NOTE — Telephone Encounter (Signed)
"  My mother wants to know what she can eat.  She did not receive chemotherapy or radiation.  Because of this what does she need to eat?  She wants milk, thinks she needs to grow what she eats."  Reviewed healthy diet guidelines. Declined offer to schedule dietician consult expressing "concern is for preventing recurrence.  Drink lots of water in addition to beverages with meals.  Lots of fruits and vegetables.  Organic a good option.  Eat chicken, fish, avoiding red meats.  Red meats should be cooked medium well to well done especially due to hyperlipidemia.  Walking to maintain a healthy weight.  Limit alcohol, no smoking reduces risks and recurrence."  Denies further questions.

## 2017-05-24 MED FILL — AMLODIPINE BESYLATE 5 MG TA: 5 | 90 days supply | Qty: 90 | Fill #3

## 2017-05-24 MED FILL — LETROZOLE 2.5 MG TABLET: 2.5 | 90 days supply | Qty: 90 | Fill #1

## 2017-06-18 IMAGING — MR MR BILATERAL BREAST WITHOUT AND WITH CONTRAST
8 of 12 series · 31 of 48 positions shown · IV contrast (17cc multihance)
Comparison: Previous exam(s).

CLINICAL DATA: The patient has had 3 biopsies in the right breast.
The mass at 12 o'clock is a fibroadenoma. The calcifications in the
lower inner and lower outer quadrants represent DCIS.

LABS:  GFR 96
EXAM:
BILATERAL BREAST MRI WITH AND WITHOUT CONTRAST
TECHNIQUE: Multiplanar, multisequence MR images of both breasts were obtained
prior to and following the intravenous administration of 17 ml of
MultiHance.

[Series 2: t2_tirm_tra ipat (a-p) · axial · 3.0mm · 0.70mm/px · 1 of 55 slices shown]
[im 1/55]
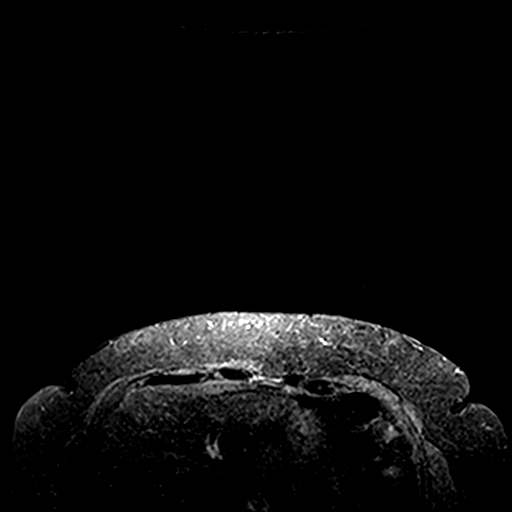

[Series 3: fl3d pre-cm no · axial · non-contrast · 1.2mm · 0.94mm/px · z∈[-41,+130]mm · 5 of 144 slices shown]
[im 1/144]
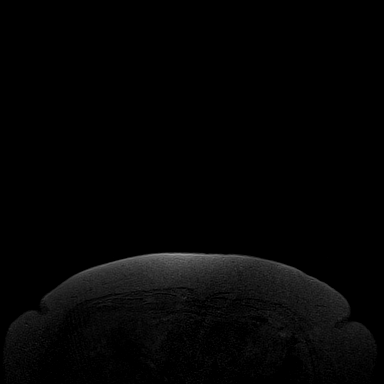
[im 36/144]
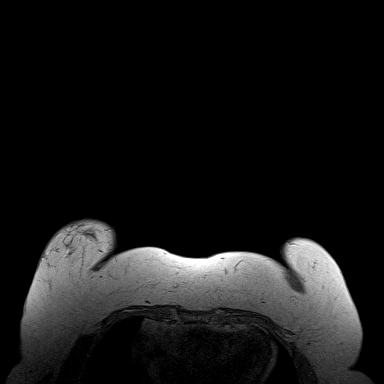
[im 72/144]
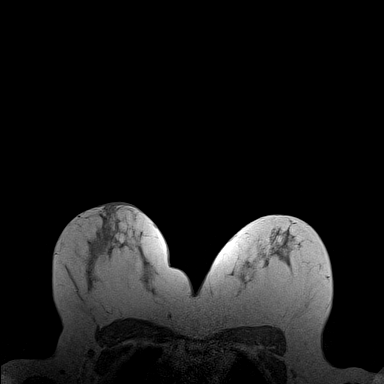
[im 108/144]
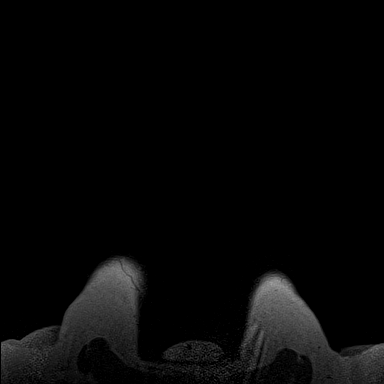
[im 144/144]
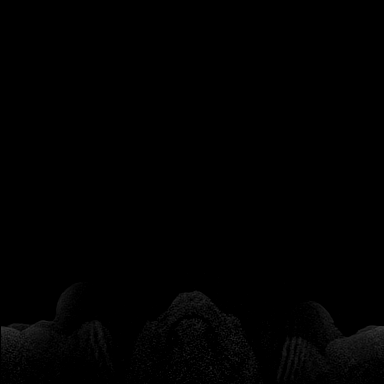

[Series 4: fl3d pre-cm · axial · non-contrast · 1.2mm · 0.94mm/px · z∈[-53,+119]mm · 5 of 144 slices shown]
[im 1/144]
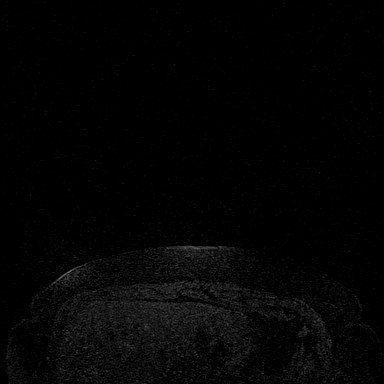
[im 36/144]
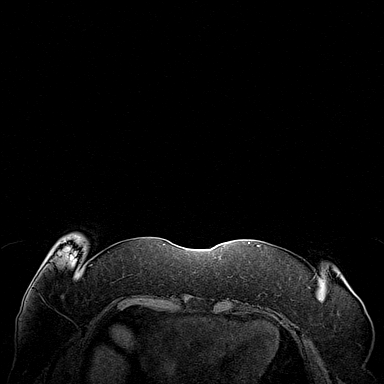
[im 72/144]
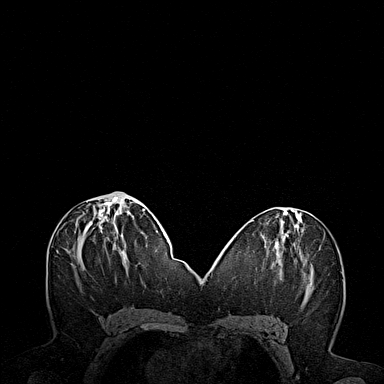
[im 108/144]
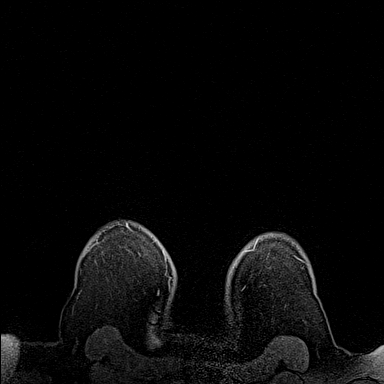
[im 144/144]
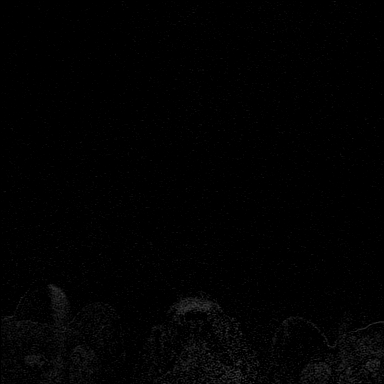

[Series 5: fl3d post immediate · axial · 1.2mm · 0.94mm/px · z∈[-53,+119]mm · 5 of 144 slices shown (1 of 3)]
[im 1/144]
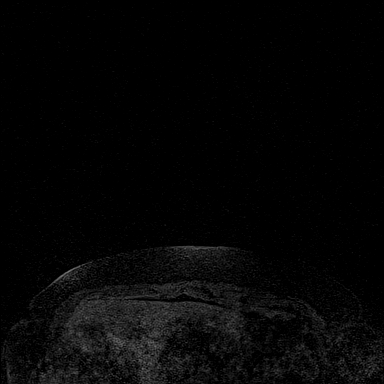
[im 36/144]
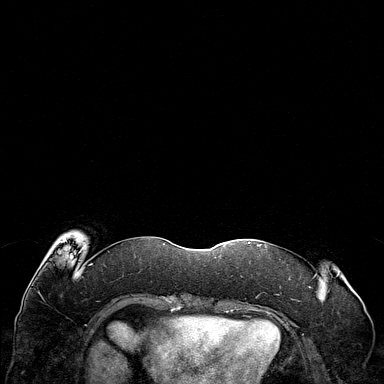
[im 72/144]
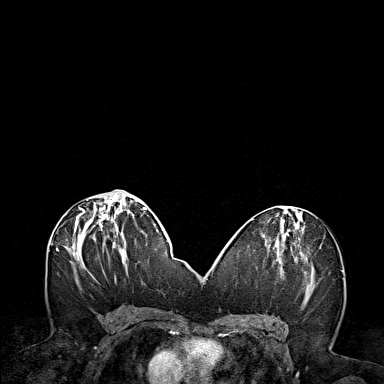
[im 108/144]
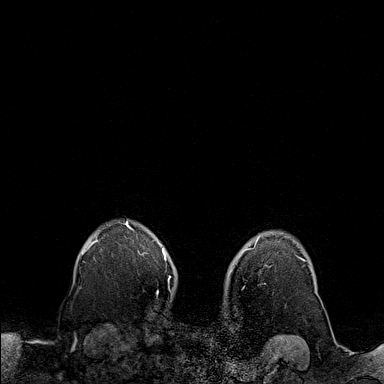
[im 144/144]
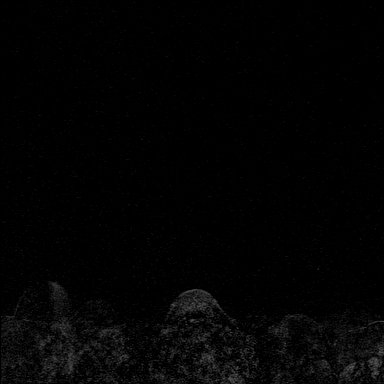

[Series 6: fl3d post immediate · axial · 1.2mm · 0.94mm/px · z∈[-53,+119]mm · 5 of 144 slices shown (2 of 3)]
[im 1/144]
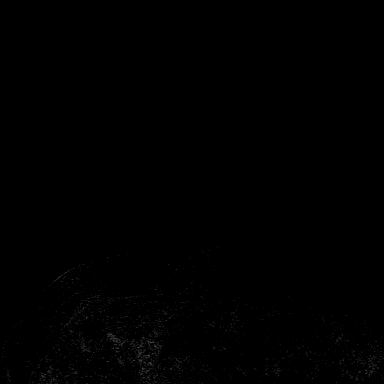
[im 36/144]
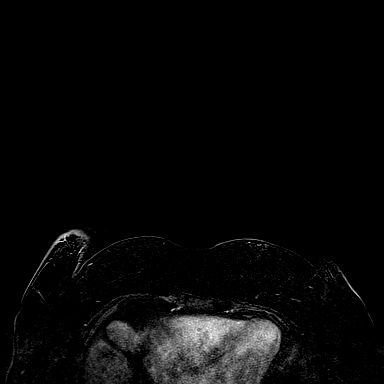
[im 72/144]
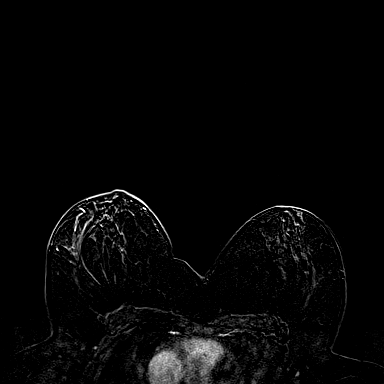
[im 108/144]
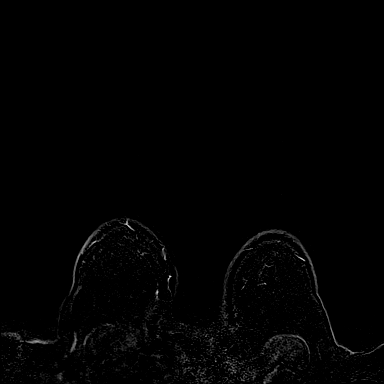
[im 144/144]
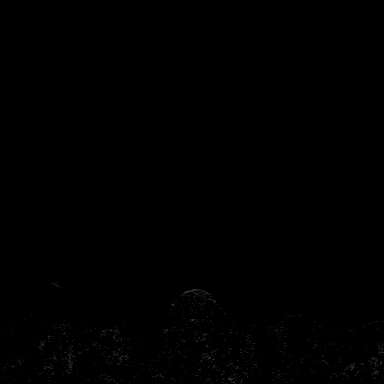

[Series 7: fl3d post immediate · axial · 172.8mm · 0.94mm/px · 1 of 1 slices shown (3 of 3)]
[im 1/1]
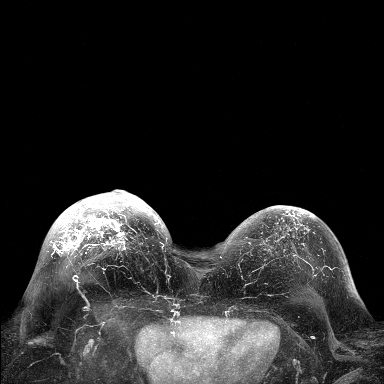

[Series 8: fl3d post 3min · axial · 1.2mm · 0.94mm/px · z∈[-53,+119]mm · 6 of 144 slices shown]
[im 1/144]
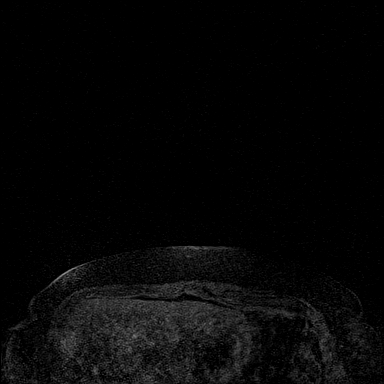
[im 29/144]
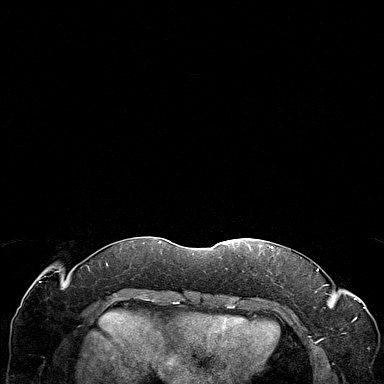
[im 58/144]
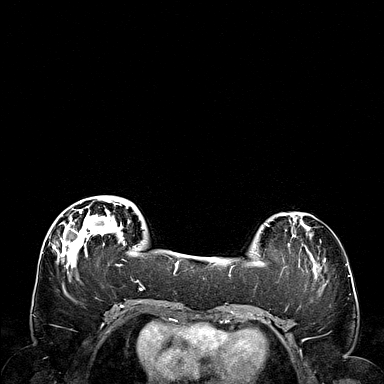
[im 86/144]
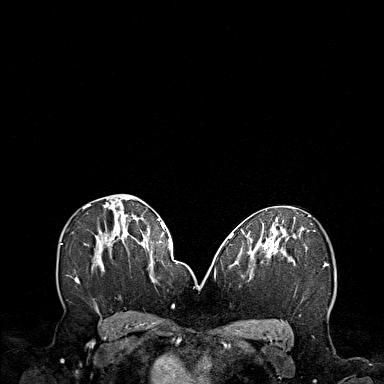
[im 115/144]
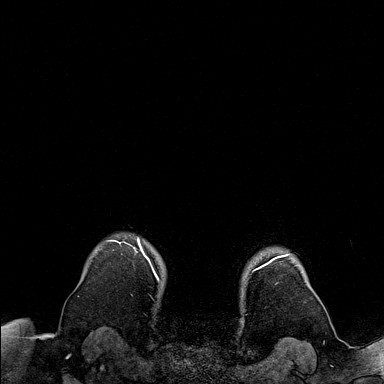
[im 144/144]
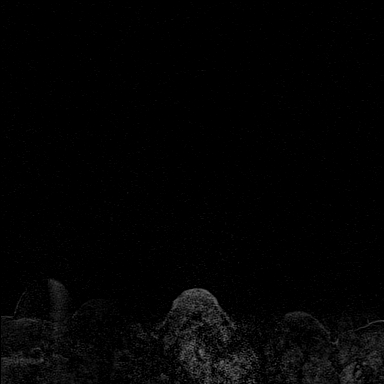

[Series 9: fl3d post 3min_sub · axial · 1.2mm · 0.94mm/px · z∈[-53,+16]mm · 3 of 144 slices shown]
[im 1/144]
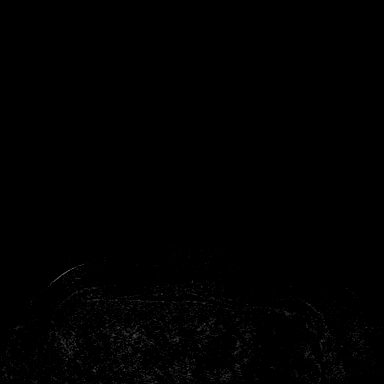
[im 29/144]
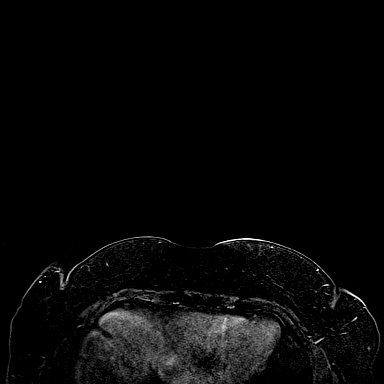
[im 58/144]
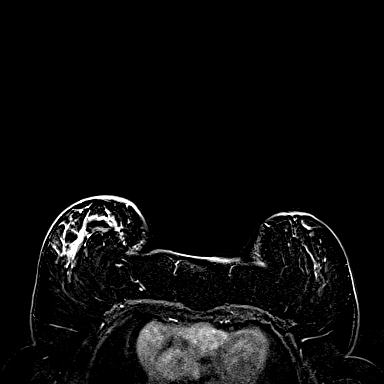

[31 of 48 positions shown; findings below may reference images not displayed]

THREE-DIMENSIONAL MR IMAGE RENDERING ON INDEPENDENT WORKSTATION:

Three-dimensional MR images were rendered by post-processing of the
original MR data on an independent workstation. The
three-dimensional MR images were interpreted, and findings are
reported in the following complete MRI report for this study. Three
dimensional images were evaluated at the independent [employer]
workstation
FINDINGS: Breast composition: b. Scattered fibroglandular tissue.

Background parenchymal enhancement: Moderate.

Right breast: There is a mass superiorly in the right breast which
correlates with the patient's known fibroadenoma. There is an
immediately adjacent biopsy clip. Two hematomas are seen in the
inferior right breast, 1 in the inner quadrant and 1 in the outer
quadrant. These represent sites of known DCIS. There is significant
surrounding enhancement in the inferior half of the right breast. At
least some of this enhancement is postprocedural.

Left breast: There is linear enhancement in the anterior aspect of
the lower outer left breast, best appreciated on MIP imaging. The
level of enhancement in this region is higher than elsewhere in the
left breast. This enhancement spans approximately 12 mm with
persistent kinetics, best seen on series 12, image 84.

Lymph nodes: No abnormal appearing lymph nodes.

Ancillary findings:  None.
IMPRESSION: 1. Two moderate to large hematomas in the inferior right breast
correlate with the known sites of DCIS. The biopsy clips on
mammography are 6.5 cm apart. Much of the enhancement may be
postprocedural, especially given the large hematomas. It would be
difficult to differentiate between post biopsy enhancement and
remaining DCIS. However, based on the multi centric disease, 6.5 cm
apart, the patient is likely a mastectomy candidate.
2. There is linear enhancement in the anterior aspect of the lower
outer left breast, best seen on MIP imaging and series 12, image 84
which demonstrates persistent enhancement kinetics. Given the
appearance of this linear non mass enhancement and the fact that the
patient has known DCIS on the right, this is moderately suspicious
for DCIS.

RECOMMENDATION:
Recommend MRI guided biopsy of the linear enhancement in the lower
outer left breast.

BI-RADS CATEGORY  4: Suspicious.

## 2017-08-27 NOTE — Telephone Encounter (Signed)
This encounter was created in error - please disregard.

## 2017-10-02 MED FILL — AMLODIPINE BESYLATE 5 MG TA: 5 | 30 days supply | Qty: 30 | Fill #2

## 2017-10-02 MED FILL — LETROZOLE 2.5 MG TABLET: 2.5 | 90 days supply | Qty: 90 | Fill #2

## 2017-10-02 MED FILL — ROSUVASTATIN CALCIUM 40 MG: 40 | 30 days supply | Qty: 30 | Fill #2

## 2017-10-02 MED FILL — FAMOTIDINE 40 MG TABLET: 40 | 90 days supply | Qty: 180 | Fill #1

## 2017-12-02 ENCOUNTER — Other Ambulatory Visit: Payer: Self-pay | Admitting: *Deleted

## 2017-12-02 ENCOUNTER — Ambulatory Visit: Payer: Self-pay

## 2017-12-02 ENCOUNTER — Ambulatory Visit: Payer: Self-pay | Attending: Internal Medicine

## 2017-12-02 DIAGNOSIS — C50311 Malignant neoplasm of lower-inner quadrant of right female breast: Secondary | ICD-10-CM

## 2017-12-02 DIAGNOSIS — Z170 Estrogen receptor positive status [ER+]: Secondary | ICD-10-CM

## 2017-12-02 DIAGNOSIS — D0511 Intraductal carcinoma in situ of right breast: Secondary | ICD-10-CM

## 2017-12-03 ENCOUNTER — Inpatient Hospital Stay: Payer: Self-pay | Attending: Oncology | Admitting: Oncology

## 2017-12-03 ENCOUNTER — Encounter: Payer: Self-pay | Admitting: Oncology

## 2017-12-03 ENCOUNTER — Inpatient Hospital Stay: Payer: Self-pay

## 2017-12-03 VITALS — BP 129/74 | HR 70 | Temp 97.4°F | Resp 18 | Ht 61.0 in | Wt 180.3 lb

## 2017-12-03 DIAGNOSIS — Z79811 Long term (current) use of aromatase inhibitors: Secondary | ICD-10-CM | POA: Insufficient documentation

## 2017-12-03 DIAGNOSIS — Z170 Estrogen receptor positive status [ER+]: Secondary | ICD-10-CM | POA: Insufficient documentation

## 2017-12-03 DIAGNOSIS — D0511 Intraductal carcinoma in situ of right breast: Secondary | ICD-10-CM | POA: Insufficient documentation

## 2017-12-03 DIAGNOSIS — C50311 Malignant neoplasm of lower-inner quadrant of right female breast: Secondary | ICD-10-CM

## 2017-12-03 LAB — CMP (CANCER CENTER ONLY)
ALBUMIN: 3.8 g/dL (ref 3.5–5.0)
ALK PHOS: 99 U/L (ref 40–150)
ALT: 29 U/L (ref 0–55)
AST: 24 U/L (ref 5–34)
Anion gap: 9 (ref 3–11)
BILIRUBIN TOTAL: 0.4 mg/dL (ref 0.2–1.2)
BUN: 12 mg/dL (ref 7–26)
CALCIUM: 9.7 mg/dL (ref 8.4–10.4)
CO2: 28 mmol/L (ref 22–29)
Chloride: 106 mmol/L (ref 98–109)
Creatinine: 0.76 mg/dL (ref 0.60–1.10)
GFR, Est AFR Am: >60 mL/min (ref >60)
GLUCOSE: 76 mg/dL (ref 70–140)
Potassium: 4 mmol/L (ref 3.5–5.1)
Sodium: 143 mmol/L (ref 136–145)
TOTAL PROTEIN: 7.4 g/dL (ref 6.4–8.3)

## 2017-12-03 LAB — CBC WITH DIFFERENTIAL (CANCER CENTER ONLY)
Basophils Absolute: 0 10*3/uL (ref 0.0–0.1)
Basophils Relative: 1 %
Eosinophils Absolute: 0.1 10*3/uL (ref 0.0–0.5)
Eosinophils Relative: 3 %
HEMATOCRIT: 40.7 % (ref 34.8–46.6)
Hemoglobin: 13.2 g/dL (ref 11.6–15.9)
LYMPHS ABS: 2.6 10*3/uL (ref 0.9–3.3)
LYMPHS PCT: 53 %
MCH: 27.6 pg (ref 25.1–34.0)
MCHC: 32.4 g/dL (ref 31.5–36.0)
MCV: 85.1 fL (ref 79.5–101.0)
MONO ABS: 0.3 10*3/uL (ref 0.1–0.9)
MONOS PCT: 6 %
NEUTROS PCT: 37 %
Neutro Abs: 1.9 10*3/uL (ref 1.5–6.5)
Platelet Count: 272 10*3/uL (ref 145–400)
RBC: 4.78 MIL/uL (ref 3.70–5.45)
RDW: 14.4 % (ref 11.2–14.5)
WBC Count: 5 10*3/uL (ref 3.9–10.3)

## 2017-12-03 MED ORDER — LETROZOLE 2.5 MG PO TABS: 2.5000 mg | ORAL_TABLET | Freq: Every day | ORAL | 12 refills | Status: DC

## 2017-12-03 NOTE — Progress Notes (Signed)
[place]  Telephone:(336) [phone] Fax:(336) [phone]     ID: Jill Adkins DOB: 01-18-1951  MR#: [id number]  UUV#:[id number]  Patient Care Team: Alfonse Spruce, FNP as PCP - General (Family Medicine) Timea Breed, Virgie Dad, MD as Consulting Physician (Oncology) [name], MD as Consulting Physician (General Surgery) OTHER MD:  CHIEF COMPLAINT: Ductal carcinoma in situ  CURRENT TREATMENT: Letrozole   BREAST CANCER HISTORY: From the original intake note:  The patient had routine screening mammography 10/05/2016 showing abnormal grouped calcifications in the right breast upper outer quadrant. On 10/11/2016 she underwent right diagnostic mammography with tomography. His found the breast density to be category B. In the right breast at the 12:00 position there was an oval asymmetry. There were also grouped calcifications in the right breast at the 7:00 middle depth. Ultrasound was performed the same day. This confirmed a 0.7 cm hypoechoic mass in the right breast upper outer quadrant. The right axilla was benign.  On 10/17/2016 the patient underwent ultrasound-guided biopsy of the 7 mm mass in the right breast and this showed ([id number]) a fibroadenoma, with no evidence of malignancy. 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  Given the multicentric disease, the patient had breast MRI 11/05/2016. There were in addition to the fibroadenoma, 2 hematomas in the inferior  right breast one in the inner and one in the outer quadrant. There was significant surrounding enhancement in the inferior half of the right breast some of which was postprocedural. The biopsy clips were 6.5 cm apart. 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   Her subsequent history is as detailed below  INTERVAL HISTORY: Jill Adkins returns today for follow-up and tretament of her estrogen receptor positive breast cancer accompanied by her brother. She continues on Letrozole, with good tolerance. She denies issues with hot flashes.   Since her last visit to the office, she underwent lumpectomy in the medial and lateral aspect of the right breast ([id number]) on 04/11/2017 with pathology showing: Atypical ductal hyperplasia. Fibrocystic changes including apocrine metaplasia with calcifications. No residual carcinoma identified.   REVIEW OF SYSTEMS: Jill Adkins reports that she came back from [place] a week ago. She notes that she will leave for [place] in 2 months. She is a world traveler. She has a small amount of breast discomfort and occasional shooting pains that last for a short amount of time. She also experiences scalp irritation. She also has toothaches and pain in the left lower jaw. She had an appointment to see her dentist, but she left the country before the appointment. For exercise, she walks in the pool and rides a stationary bike in her house. She denies unusual headaches, visual changes, nausea, vomiting, or dizziness. There has been no unusual cough, phlegm production, or pleurisy. This been no change in bowel or bladder habits. She denies unexplained fatigue or unexplained weight loss, bleeding, rash, or fever. A detailed review of systems was otherwise stable.  PAST MEDICAL HISTORY: Past Medical History:  Diagnosis Date  . Anemia   . Arthritis   . Breast cancer ([place])    right breast  . Cataracts,  bilateral   . GERD (gastroesophageal reflux disease)   . Glaucoma   . Headache   . History of anemia   . Hyperlipidemia   . Hypertension   . Seasonal allergies     PAST SURGICAL HISTORY: Past Surgical History:  Procedure Laterality Date  . BREAST LUMPECTOMY WITH RADIOACTIVE SEED LOCALIZATION Right 04/11/2017   Procedure: RIGHT BREAST LUMPECTOMY WITH RADIOACTIVE SEED LOCALIZATION X2;  Surgeon: [name], MD;  Location: [facility];  Service: General;  Laterality: Right;    FAMILY HISTORY Family History  Problem Relation Age of Onset  . Hypertension Father   . Colon cancer Brother 33  . Esophageal cancer Neg Hx   . Rectal cancer Neg Hx   . Stomach cancer Neg Hx   The patient's father died at the age of 70, with a history of hypertension; the patient's mother is alive at age 51. The patient had 3 brothers 2 sisters. There is no history of cancer in the family to her knowledge.  GYNECOLOGIC HISTORY:  Menarche age 65, first live birth age 45. The patient is GX P3. She does not remember when she stopped having periods. She did not use oral contraceptives. She used birth control pills remotely with no complications. No LMP recorded. Patient is postmenopausal.   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 She describes herself is single. She is currently residing with her daughter [name] in [place]. [name] works in Ocean[employer]. [name] lives in [place] and is a Scientist, clinical (histocompatibility and immunogenetics) and Electrical engineer lives in [place] where she is studying. The patient has 11 grandchildren. The patient attends the local mosque  ADVANCED DIRECTIVES: Not in place   HEALTH MAINTENANCE: Social History   Tobacco Use  . Smoking status: Never Smoker  . Smokeless tobacco: Never Used  Substance Use Topics  . Alcohol use: No  . Drug use: No     Colonoscopy:Never  PAP:  Bone density: Never   Allergies  Allergen Reactions  . No Known Allergies     Current Outpatient  Medications  Medication Sig Dispense Refill  . acetaminophen (TYLENOL) 500 MG tablet Take 2 tablets (1,000 mg total) by mouth every 6 (six) hours as needed for headache. 60 tablet 0  . diphenhydrAMINE (BENADRYL) 25 MG tablet Take 25 mg by mouth at bedtime.     . famotidine (PEPCID) 40 MG tablet Take 1 tablet (40 mg total) by mouth 2 (two) times daily. 60 tablet 11  . letrozole (FEMARA) 2.5 MG tablet Take 1 tablet (2.5 mg total) by mouth daily. 30 tablet 12  . amLODipine (NORVASC) 5 MG tablet Take 1 tablet (5 mg total) by mouth daily. 90 tablet 0  . rosuvastatin (CRESTOR) 40 MG tablet Take 1 tablet (40 mg total) by mouth daily. (Patient taking differently: Take 40 mg by mouth every evening. ) 90 tablet 0   Current Facility-Administered Medications  Medication Dose Route Frequency Provider Last Rate Last Dose  . 0.9 %  sodium chloride infusion  500 mL Intravenous Continuous Pyrtle, Lajuan Lines, MD        OBJECTIVE: Middle-aged African woman in no acute distress Vitals:   12/03/17 0937  BP: 129/74  Pulse: 70  Resp: 18  Temp: (!) 97.4 F (36.3 C)  SpO2: 99%  Body mass index is 34.07 kg/m.    ECOG FS:1 - Symptomatic but completely ambulatory  Sclerae unicteric, pupils round and equal Oropharynx clear and moist; c/o pain right lower molars No cervical or supraclavicular adenopathy Lungs no rales or rhonchi Heart regular rate and rhythm Abd soft, nontender, positive bowel sounds MSK no focal spinal tenderness, no upper extremity lymphedema Neuro: nonfocal, well oriented, appropriate affect Breasts: The right breast is status post lumpectomy.  The cosmetic result is excellent.  There is no evidence of local recurrence.  The left breast is benign.  Both axillae are benign.  LAB RESULTS:  CMP     Component Value Date/Time   NA 141 04/04/2017 0855   K 3.8 04/04/2017 0855   CL 108 04/04/2017 0855   CO2 27 04/04/2017 0855   GLUCOSE 96 04/04/2017 0855   BUN 6 04/04/2017 0855    CREATININE 0.63 04/04/2017 0855   CREATININE 0.74 10/04/2016 1001   CALCIUM 9.6 04/04/2017 0855   PROT 7.6 10/04/2016 1023   ALBUMIN 4.4 10/04/2016 1023   AST 18 10/04/2016 1023   ALT 12 10/04/2016 1023   ALKPHOS 72 10/04/2016 1023   BILITOT 0.6 10/04/2016 1023   GFRNONAA >60 04/04/2017 0855   GFRAA >60 04/04/2017 0855    INo results found for: SPEP, UPEP  Lab Results  Component Value Date   WBC 5.0 12/03/2017   NEUTROABS 1.9 12/03/2017   HGB 13.0 04/04/2017   HCT 40.7 12/03/2017   MCV 85.1 12/03/2017   PLT 272 12/03/2017      Chemistry      Component Value Date/Time   NA 141 04/04/2017 0855   K 3.8 04/04/2017 0855   CL 108 04/04/2017 0855   CO2 27 04/04/2017 0855   BUN 6 04/04/2017 0855   CREATININE 0.63 04/04/2017 0855   CREATININE 0.74 10/04/2016 1001      Component Value Date/Time   CALCIUM 9.6 04/04/2017 0855   ALKPHOS 72 10/04/2016 1023   AST 18 10/04/2016 1023   ALT 12 10/04/2016 1023   BILITOT 0.6 10/04/2016 1023       No results found for: LABCA2  No components found for: LABCA125  No results for input(s): INR in the last 168 hours.  Urinalysis    Component Value Date/Time   COLORURINE YELLOW 06/06/2009 1613   APPEARANCEUR CLEAR 06/06/2009 1613   LABSPEC 1.020 06/06/2009 1613   PHURINE 6.5 06/06/2009 1613   GLUCOSEU NEGATIVE 06/06/2009 1613   HGBUR NEGATIVE 06/06/2009 1613   BILIRUBINUR NEGATIVE 06/06/2009 1613   KETONESUR NEGATIVE 06/06/2009 1613   PROTEINUR NEGATIVE 06/06/2009 1613   UROBILINOGEN 0.2 06/06/2009 1613   NITRITE NEGATIVE 06/06/2009 1613   LEUKOCYTESUR  06/06/2009 1613    NEGATIVE MICROSCOPIC NOT DONE ON URINES WITH NEGATIVE PROTEIN, BLOOD, LEUKOCYTES, NITRITE, OR GLUCOSE <1000 mg/dL.     STUDIES: Mammography due, order placed today  ELIGIBLE FOR AVAILABLE RESEARCH PROTOCOL: No  ASSESSMENT: 67 y.o. [place] speaker originally from [place] status post right breast upper outer and lower outer quadrant biopsies 10/17/2016  and 10/29/2016, both showing ductal carcinoma in situ, grade 2, strongly estrogen and progesterone receptor positive.  (a) the patient opted to postpone definitive surgery for travel reasons  (1) started letrozole 11/07/2016  (2) MRI guided biopsy of the left breast suspicious area was benign (02/18/2017, [id number])  (3) Status post right lumpectomy 04/11/2017 ([id number]) showing: Atypical ductal hyperplasia. No residual carcinoma identified.   (4) did not receive adjuvant radiation as the patient had to  leave the country.  (5) to continue to result for a total of 5 years (through January 2023)  PLAN: Jill Adkins is now about 6 months out from definitive surgery for her breast cancer with no evidence of disease recurrence.  This is very favorable.  She is tolerating letrozole well.  The plan will be to continue that for a total of 5 years.  She is a little bit behind on her mammogram.  I have placed the order so that can be done at [facility] within the next week or 2.  Also she is having some dental problems and does not have access to dentistry.  I have placed a referral to our dentist Dr. Dorothyann Gibbs to see if he can be of assistance to her  [place] will [place] another 2 [place] and then go to [place].  If she is around in a year she will see me again otherwise we will work her in when she is in town  Newaygo, Virgie Dad, MD  12/03/17 9:58 AM Medical Oncology and Hematology [facility] [place], [place] [address] Tel. [phone]    Fax. [phone]    This document serves as a record of services personally performed by Lurline Del, MD. It was created on his behalf by Sheron Nightingale, a trained medical scribe. The creation of this record is based on the scribe's personal observations and the provider's statements to them.   I have reviewed the above documentation for accuracy and completeness, and I agree with the above.

## 2017-12-06 ENCOUNTER — Telehealth: Payer: Self-pay | Admitting: *Deleted

## 2017-12-06 NOTE — Telephone Encounter (Signed)
This RN returned transferred VM from scheduling left by pt's daughter requesting a return call to [phone].  VM received - this RN left general message requesting a return call.

## 2017-12-09 ENCOUNTER — Telehealth: Payer: Self-pay

## 2017-12-09 NOTE — Telephone Encounter (Signed)
Received VM from daughter, no specific reason listed, requested call back.  Tried her number but no answer, left her VM with our contact info.

## 2017-12-11 ENCOUNTER — Other Ambulatory Visit: Payer: Self-pay | Admitting: *Deleted

## 2017-12-18 ENCOUNTER — Telehealth: Payer: Self-pay | Admitting: *Deleted

## 2017-12-18 ENCOUNTER — Other Ambulatory Visit: Payer: Self-pay | Admitting: Pharmacist

## 2017-12-18 ENCOUNTER — Other Ambulatory Visit: Payer: Self-pay | Admitting: *Deleted

## 2017-12-18 DIAGNOSIS — I10 Essential (primary) hypertension: Secondary | ICD-10-CM

## 2017-12-18 MED ORDER — AMLODIPINE BESYLATE 5 MG PO TABS: 5.0000 mg | ORAL_TABLET | Freq: Every day | ORAL | 0 refills | Status: DC

## 2017-12-18 MED ORDER — CEPHALEXIN 500 MG PO CAPS: 500.0000 mg | ORAL_CAPSULE | Freq: Two times a day (BID) | ORAL | 0 refills | Status: DC

## 2017-12-18 NOTE — Telephone Encounter (Signed)
This RN spoke with pt's daughter per her VM stating " she is having pain on her side that goes into her back ".  Jill Adkins states Jill Adkins developed discomfort about 5 days ago - she had her mammogram yesterday " and the nurse there said maybe it was from her medication ?"  Pt is currently on letrozole.  Noted mild language barrier/communication issues secondary to english as second language for Jill Adkins as well as she was not presently with her mother ( who speaks very little english ).  Was able to have Jill Adkins state pain is " behind the breast - under the arm and goes to her back and on the side of her surgery ( Right side ).  Area seems swollen and tender to touch " it is giving her fevers " which seems to mean - warm to touch and tender.  This note will be given to MD for review- for appropriate follow up.

## 2017-12-18 NOTE — Telephone Encounter (Signed)
Per MD review prescription for Keflex obtained - spoke with pt's dtr Verdis Frederickson- and noted phx as [facility]- which closes at 5 pm.  Prescription sent to [facility] on [address].  Appointment being made for symptom management for further assessment.

## 2017-12-19 ENCOUNTER — Inpatient Hospital Stay: Payer: Self-pay | Attending: Oncology | Admitting: Medical

## 2017-12-19 ENCOUNTER — Inpatient Hospital Stay: Payer: Self-pay

## 2017-12-19 VITALS — BP 133/49 | HR 69 | Temp 98.7°F | Resp 18 | Ht 61.0 in | Wt 178.3 lb

## 2017-12-19 DIAGNOSIS — J0110 Acute frontal sinusitis, unspecified: Secondary | ICD-10-CM

## 2017-12-19 DIAGNOSIS — D0511 Intraductal carcinoma in situ of right breast: Secondary | ICD-10-CM

## 2017-12-19 DIAGNOSIS — M62830 Muscle spasm of back: Secondary | ICD-10-CM

## 2017-12-19 DIAGNOSIS — K0889 Other specified disorders of teeth and supporting structures: Secondary | ICD-10-CM

## 2017-12-19 MED ORDER — BACLOFEN 10 MG PO TABS: 10.0000 mg | ORAL_TABLET | Freq: Three times a day (TID) | ORAL | 0 refills | Status: DC

## 2017-12-19 MED ORDER — AMOXICILLIN-POT CLAVULANATE 875-125 MG PO TABS: 1.0000 | ORAL_TABLET | Freq: Two times a day (BID) | ORAL | 0 refills | Status: DC

## 2017-12-19 MED FILL — BACLOFEN 10 MG TABLET: 10 | 10 days supply | Qty: 30 | Fill #0

## 2017-12-19 MED FILL — AMLODIPINE BESYLATE 5 MG TA: 5 | 30 days supply | Qty: 30 | Fill #0

## 2017-12-19 MED FILL — AMOX-CLAV 875-125 MG TABLET: 875-125 | 10 days supply | Qty: 20 | Fill #0

## 2017-12-19 NOTE — Progress Notes (Signed)
[facility]pt[facility] Progress Note   Jill Adkins [id number] 1951/08/01 67 y.o.  Jill Adkins is managed by Jill Adkins  Actively treated with chemotherapy: no   Assessment: Plan:    Muscle spasm of back - Plan: baclofen (LIORESAL) 10 MG tablet  Acute frontal sinusitis, recurrence not specified - Plan: amoxicillin-clavulanate (AUGMENTIN) 875-125 MG tablet  Pain, dental   Muscle spasm of the right superior lateral back: The patient was given a prescription for baclofen 10 mg p.o. 3 times daily as needed muscle spasms.  She was additionally told to use a heating pad to the area for 20 minutes on and 20 minutes off.  She was cautioned not to sleep on the heating pad due to risk for burns.    Acute frontal sinusitis: Patient was given a prescription for Augmentin 875-125, p.o. twice daily times 10 days.  She was instructed to stop Keflex.    Dental pain: The patient was given contact information regarding free dental clinics in [place], [place], and [place].   Please see After Visit Summary for patient specific instructions.  Future Appointments  Date Time Provider [place]  12/20/2017  4:10 PM Jill Adkins [facility] None  03/25/2018  1:30 PM [facility] LAB 1 [facility] None  03/25/2018  2:00 PM Magrinat, Virgie Dad, MD [facility] None  12/04/2018  9:30 AM Magrinat, Virgie Dad, MD [facility] None    No orders of the defined types were placed in this encounter.      Subjective:   Patient ID:  Jill Adkins is a 67 y.o. (DOB [birth year]) female.  Chief Complaint:  Chief Complaint  Patient presents with  . Pain    HPI Jill Adkins is a 67 year old female with a history of a ductal carcinoma in situ of the right breast who is currently treated with letrozole under the care of Jill Adkins. She was last seen by Jill Adkins on 12/03/2017 with plans to follow up on 03/25/2018. She contacted our office yesterday stating that she was having pain in on side  that radiates to the back.  Patient reports that she was on a treadmill around 10 days ago when she fell as she had set the speed incorrectly.  She has had pain and swelling in her right lateral chest wall since that time.  She also has been having a headache, right upper toothache, fevers, chills, postnasal drainage, and nasal discharge.  She has a history of sinus infections.  Medications: I have reviewed the patient's current medications.  Allergies:  Allergies  Allergen Reactions  . No Known Allergies     Past Medical History:  Diagnosis Date  . Anemia   . Arthritis   . Breast cancer ([place])    right breast  . Cataracts, bilateral   . GERD (gastroesophageal reflux disease)   . Glaucoma   . Headache   . History of anemia   . Hyperlipidemia   . Hypertension   . Seasonal allergies     Past Surgical History:  Procedure Laterality Date  . BREAST LUMPECTOMY WITH RADIOACTIVE SEED LOCALIZATION Right 04/11/2017   Procedure: RIGHT BREAST LUMPECTOMY WITH RADIOACTIVE SEED LOCALIZATION X2;  Surgeon: Jill Keens, MD;  Location: [facility];  Service: General;  Laterality: Right;    Family History  Problem Relation Age of Onset  . Hypertension Father   . Colon cancer Brother 84  . Esophageal cancer Neg Hx   . Rectal cancer Neg Hx   . Stomach cancer Neg Hx  Social History   Socioeconomic History  . Marital status: Widowed    Spouse name: Not on file  . Number of children: Not on file  . Years of education: Not on file  . Highest education level: Not on file  Social Needs  . Financial resource strain: Not on file  . Food insecurity - worry: Not on file  . Food insecurity - inability: Not on file  . Transportation needs - medical: Not on file  . Transportation needs - non-medical: Not on file  Occupational History  . Not on file  Tobacco Use  . Smoking status: Never Smoker  . Smokeless tobacco: Never Used  Substance and Sexual Activity  . Alcohol use: No  . Drug use: No   . Sexual activity: No    Partners: Male  Other Topics Concern  . Not on file  Social History Narrative  . Not on file    Past Medical History, Surgical history, Social history, and Family history were reviewed and updated as appropriate.   Please see review of systems for further details on the patient's review from today.   Review of Systems:  Review of Systems  Constitutional: Positive for chills and fever.  HENT: Positive for postnasal drip and rhinorrhea. Negative for sinus pressure, sinus pain and trouble swallowing.   Respiratory: Negative for cough and shortness of breath.   Cardiovascular: Negative for chest pain and palpitations.  Musculoskeletal: Positive for back pain.  Neurological: Positive for headaches.    Objective:   Physical Exam:  BP (!) 133/49 (BP Location: Left Arm, Patient Position: Sitting)   Pulse 69   Temp 98.7 F (37.1 C) (Oral)   Resp 18   Ht 5\' 1"  (1.549 m)   Wt 178 lb 4.8 oz (80.9 kg)   SpO2 99%   BMI 33.69 kg/m  ECOG: 0  Physical Exam  Constitutional: No distress.  Jill Ip, RN and the patient's brother were present in the room for the patient's entire examination today.  HENT:  Head: Normocephalic and atraumatic.  Right Ear: External ear normal.  Left Ear: External ear normal.  Nose: Right sinus exhibits maxillary sinus tenderness. Right sinus exhibits no frontal sinus tenderness. Left sinus exhibits maxillary sinus tenderness. Left sinus exhibits no frontal sinus tenderness.  Mouth/Throat: Oropharynx is clear and moist. No oropharyngeal exudate.  Neck: Normal range of motion. Neck supple.  Cardiovascular: Normal rate, regular rhythm and normal heart sounds. Exam reveals no gallop and no friction rub.  No murmur heard. Pulmonary/Chest: Breath sounds normal. No respiratory distress. She has no wheezes. She has no rales.    Musculoskeletal: She exhibits tenderness.       Arms: Lymphadenopathy:    She has no cervical adenopathy.    Neurological: She is alert. Coordination normal.  Skin: Skin is warm and dry. No rash noted. She is not diaphoretic. No erythema.    Lab Review:     Component Value Date/Time   NA 143 12/03/2017 0916   K 4.0 12/03/2017 0916   CL 106 12/03/2017 0916   CO2 28 12/03/2017 0916   GLUCOSE 76 12/03/2017 0916   BUN 12 12/03/2017 0916   CREATININE 0.76 12/03/2017 0916   CREATININE 0.74 10/04/2016 1001   CALCIUM 9.7 12/03/2017 0916   PROT 7.4 12/03/2017 0916   ALBUMIN 3.8 12/03/2017 0916   AST 24 12/03/2017 0916   ALT 29 12/03/2017 0916   ALKPHOS 99 12/03/2017 0916   BILITOT 0.4 12/03/2017 0916   GFRNONAA >60  12/03/2017 0916   GFRAA >60 12/03/2017 0916       Component Value Date/Time   WBC 5.0 12/03/2017 0916   WBC 4.6 04/04/2017 0855   RBC 4.78 12/03/2017 0916   HGB 13.0 04/04/2017 0855   HCT 40.7 12/03/2017 0916   PLT 272 12/03/2017 0916   MCV 85.1 12/03/2017 0916   MCH 27.6 12/03/2017 0916   MCHC 32.4 12/03/2017 0916   RDW 14.4 12/03/2017 0916   LYMPHSABS 2.6 12/03/2017 0916   MONOABS 0.3 12/03/2017 0916   EOSABS 0.1 12/03/2017 0916   BASOSABS 0.0 12/03/2017 0916   -------------------------------  Imaging from last 24 hours (if applicable):  Radiology interpretation: No results found.

## 2017-12-20 ENCOUNTER — Encounter: Payer: Self-pay | Admitting: Nurse Practitioner

## 2017-12-20 ENCOUNTER — Telehealth: Payer: Self-pay | Admitting: *Deleted

## 2017-12-20 ENCOUNTER — Ambulatory Visit: Payer: Self-pay | Attending: Nurse Practitioner | Admitting: Nurse Practitioner

## 2017-12-20 VITALS — BP 115/78 | HR 77 | Temp 99.2°F | Ht 61.0 in | Wt 179.8 lb

## 2017-12-20 DIAGNOSIS — I10 Essential (primary) hypertension: Secondary | ICD-10-CM

## 2017-12-20 DIAGNOSIS — Z79899 Other long term (current) drug therapy: Secondary | ICD-10-CM | POA: Insufficient documentation

## 2017-12-20 DIAGNOSIS — K219 Gastro-esophageal reflux disease without esophagitis: Secondary | ICD-10-CM | POA: Insufficient documentation

## 2017-12-20 DIAGNOSIS — K0889 Other specified disorders of teeth and supporting structures: Secondary | ICD-10-CM | POA: Insufficient documentation

## 2017-12-20 DIAGNOSIS — E782 Mixed hyperlipidemia: Secondary | ICD-10-CM

## 2017-12-20 DIAGNOSIS — K089 Disorder of teeth and supporting structures, unspecified: Secondary | ICD-10-CM

## 2017-12-20 DIAGNOSIS — Z760 Encounter for issue of repeat prescription: Secondary | ICD-10-CM | POA: Insufficient documentation

## 2017-12-20 DIAGNOSIS — G44229 Chronic tension-type headache, not intractable: Secondary | ICD-10-CM

## 2017-12-20 DIAGNOSIS — C50911 Malignant neoplasm of unspecified site of right female breast: Secondary | ICD-10-CM | POA: Insufficient documentation

## 2017-12-20 MED ORDER — CETIRIZINE HCL 10 MG PO TABS: 10.0000 mg | ORAL_TABLET | Freq: Every day | ORAL | 11 refills | Status: DC

## 2017-12-20 MED FILL — ?CETIRIZINE HCL 10 MG TABLE: 10 | 30 days supply | Qty: 30 | Fill #0

## 2017-12-20 NOTE — Telephone Encounter (Signed)
This RN return VM ( x 2 ) to pt's dtr Verdis Frederickson requesting a call.  Obtained VM - message left call returned and to please call again.

## 2017-12-20 NOTE — Patient Instructions (Addendum)
Dental Pain Dental pain may be caused by many things, including:  Tooth decay (cavities or caries). Cavities cause the nerve of your tooth to be open to air and hot or cold temperatures. This can cause pain or discomfort.  Abscess or infection. A dental abscess is an area that is full of infected pus from a bacterial infection in the inner part of the tooth (pulp). It usually happens at the end of the tooth's root.  Injury.  An unknown reason (idiopathic).  Your pain may be mild or severe. It may only happen when:  You are chewing.  You are exposed to hot or cold temperature.  You are eating or drinking sugary foods or beverages, such as: ? Soda. ? Candy.  Your pain may also be there all of the time. Follow these instructions at home: Watch your dental pain for any changes. Do these things to lessen your discomfort:  Take medicines only as told by your dentist.  If your dentist tells you to take an antibiotic medicine, finish all of it even if you start to feel better.  Keep all follow-up visits as told by your dentist. This is important.  Do not apply heat to the outside of your face.  Rinse your mouth or gargle with salt water if told by your dentist. This helps with pain and swelling. ? You can make salt water by adding  tsp of salt to 1 cup of warm water.  Apply ice to the painful area of your face: ? Put ice in a plastic bag. ? Place a towel between your skin and the bag. ? Leave the ice on for 20 minutes, 2-3 times per day.  Avoid foods or drinks that cause you pain, such as: ? Very hot or very cold foods or drinks. ? Sweet or sugary foods or drinks.  Contact a doctor if:  Your pain is not helped with medicines.  Your symptoms are worse.  You have new symptoms. Get help right away if:  You cannot open your mouth.  You are having trouble breathing or swallowing.  You have a fever.  Your face, neck, or jaw is puffy (swollen). This information is not  intended to replace advice given to you by your health care provider. Make sure you discuss any questions you have with your health care provider. Document Released: 03/12/2008 Document Revised: 03/01/2016 Document Reviewed: 09/20/2014 Elsevier Interactive Patient Education  2018 [employer]  

## 2017-12-20 NOTE — Progress Notes (Signed)
Assessment & Plan:  Jill Adkins was seen today for establish care, medication refill and dental pain.  Diagnoses and all orders for this visit:  Chronic tension-type headache, not intractable -     cetirizine (ZYRTEC) 10 MG tablet; Take 1 tablet (10 mg total) by mouth daily. May take Acetaminophen for pain relief.   Essential hypertension Continue all antihypertensives as prescribed.  Remember to bring in your blood pressure log with you for your follow up appointment.  DASH/Mediterranean Diets are healthier choices for HTN.    Mixed hyperlipidemia Work on a low fat, heart healthy diet and participate in regular aerobic exercise program to control as well by working out at least 150 minutes per week. No fried foods. No junk foods, sodas, sugary drinks, unhealthy snacking, or smoking.   Poor dentition -     Ambulatory referral to Dentistry  Patient has been counseled on age-appropriate routine health concerns for screening and prevention. These are reviewed and up-to-date. Referrals have been placed accordingly. Immunizations are up-to-date or declined.    Subjective:   Chief Complaint  Patient presents with  . Establish Care    Pt is here to establish care for check up.   . Medication Refill  . Dental Pain    Right side tooth pain for a while. Take tylenol for pain.   HPI Jill Adkins 67 y.o. female presents to office today to establish care. She is currently being treated for right breast cancer (s/p lumpectomy) and being followed by Dr. Jana Hakim (Oncology).   Essential Hypertension Chronic. Stable. She does not check her blood pressure at home. Endorses medication compliance taking amlodipine 5mg  daily. Denies chest pain, shortness of breath, palpitations, lightheadedness, dizziness or BLE edema.  BP Readings from Last 3 Encounters:  12/20/17 115/78  12/19/17 (!) 133/49  12/03/17 129/74    Migraines Headaches occurring every day. She was recently prescribed Augmentin for  sinusitis which may be contributing to her headaches. She is also endorsing dental pain. She has been given resources for dental clinics in various counties. Taking tylenol for pain relief.    Hyperlipidemia LDL not at goal. Endorses medication compliance with taking Crestor. She denies statin intolerance or myalgias.  Lab Results  Component Value Date   LDLCALC 152 (H) 03/07/2017   Review of Systems  Constitutional: Positive for chills and malaise/fatigue. Negative for fever and weight loss.  HENT: Positive for congestion and sinus pain. Negative for nosebleeds.        Dental pain  Eyes: Negative.  Negative for blurred vision, double vision and photophobia.  Respiratory: Negative.  Negative for cough, shortness of breath and wheezing.   Cardiovascular: Negative.  Negative for chest pain, palpitations and leg swelling.  Gastrointestinal: Negative.  Negative for heartburn, nausea and vomiting.  Musculoskeletal: Positive for back pain. Negative for myalgias.  Neurological: Positive for headaches. Negative for dizziness, focal weakness and seizures.  Psychiatric/Behavioral: Negative.  Negative for suicidal ideas.    Past Medical History:  Diagnosis Date  . Anemia   . Arthritis   . Breast cancer ([place])    right breast  . Cataracts, bilateral   . GERD (gastroesophageal reflux disease)   . Glaucoma   . Headache   . History of anemia   . Hyperlipidemia   . Hypertension   . Seasonal allergies     Past Surgical History:  Procedure Laterality Date  . BREAST LUMPECTOMY WITH RADIOACTIVE SEED LOCALIZATION Right 04/11/2017   Procedure: RIGHT BREAST LUMPECTOMY WITH RADIOACTIVE SEED LOCALIZATION  X2;  Surgeon: [name], MD;  Location: [facility];  Service: General;  Laterality: Right;    Family History  Problem Relation Age of Onset  . Hypertension Father   . Colon cancer Brother 67  . Esophageal cancer Neg Hx   . Rectal cancer Neg Hx   . Stomach cancer Neg Hx     Social History  Reviewed with no changes to be made today.   Outpatient Medications Prior to Visit  Medication Sig Dispense Refill  . acetaminophen (TYLENOL) 500 MG tablet Take 2 tablets (1,000 mg total) by mouth every 6 (six) hours as needed for headache. 60 tablet 0  . amLODipine (NORVASC) 5 MG tablet Take 1 tablet (5 mg total) by mouth daily. 30 tablet 0  . amoxicillin-clavulanate (AUGMENTIN) 875-125 MG tablet Take 1 tablet by mouth 2 (two) times daily. 20 tablet 0  . baclofen (LIORESAL) 10 MG tablet Take 1 tablet (10 mg total) by mouth 3 (three) times daily. 30 each 0  . diphenhydrAMINE (BENADRYL) 25 MG tablet Take 25 mg by mouth at bedtime.     . famotidine (PEPCID) 40 MG tablet Take 1 tablet (40 mg total) by mouth 2 (two) times daily. 60 tablet 11  . letrozole (FEMARA) 2.5 MG tablet Take 1 tablet (2.5 mg total) by mouth daily. 180 tablet 12  . Vitamin D, Cholecalciferol, 1000 units CAPS Take 1,000 Units by mouth 1 day or 1 dose.    . cephALEXin (KEFLEX) 500 MG capsule Take 1 capsule (500 mg total) by mouth 2 (two) times daily. 10 capsule 0  . rosuvastatin (CRESTOR) 40 MG tablet Take 1 tablet (40 mg total) by mouth daily. (Patient taking differently: Take 40 mg by mouth every evening. ) 90 tablet 0   Facility-Administered Medications Prior to Visit  Medication Dose Route Frequency Provider Last Rate Last Dose  . 0.9 %  sodium chloride infusion  500 mL Intravenous Continuous Pyrtle, Lajuan Lines, MD        Allergies  Allergen Reactions  . No Known Allergies   . Shrimp [Shellfish Allergy]     Headache, unable to talk       Objective:    BP 115/78 (BP Location: Right Arm, Patient Position: Sitting, Cuff Size: Large)   Pulse 77   Temp 99.2 F (37.3 C) (Oral)   Ht 5\' 1"  (1.549 m)   Wt 179 lb 12.8 oz (81.6 kg)   SpO2 95%   BMI 33.97 kg/m  Wt Readings from Last 3 Encounters:  12/20/17 179 lb 12.8 oz (81.6 kg)  12/19/17 178 lb 4.8 oz (80.9 kg)  12/03/17 180 lb 4.8 oz (81.8 kg)    Physical Exam    Constitutional: She is oriented to person, place, and time. She appears well-developed and well-nourished. She is cooperative.  HENT:  Head: Normocephalic and atraumatic.  Right Ear: Tympanic membrane, external ear and ear canal normal.  Left Ear: Hearing, tympanic membrane, external ear and ear canal normal.  Nose: Mucosal edema and rhinorrhea present. Right sinus exhibits maxillary sinus tenderness. Left sinus exhibits maxillary sinus tenderness.  Mouth/Throat: Abnormal dentition. Dental caries present. Posterior oropharyngeal erythema present.    Eyes: EOM are normal.  Neck: Normal range of motion.  Cardiovascular: Normal rate, regular rhythm and normal heart sounds. Exam reveals no gallop and no friction rub.  No murmur heard. Pulmonary/Chest: Effort normal and breath sounds normal. No tachypnea. No respiratory distress. She has no decreased breath sounds. She has no wheezes. She has no  rhonchi. She has no rales. She exhibits no tenderness.  Abdominal: Soft. Bowel sounds are normal.  Musculoskeletal: Normal range of motion. She exhibits no edema.  Neurological: She is alert and oriented to person, place, and time. Coordination normal.  Skin: Skin is warm and dry.  Psychiatric: She has a normal mood and affect. Her behavior is normal. Judgment and thought content normal.  Nursing note and vitals reviewed.        Patient has been counseled extensively about nutrition and exercise as well as the importance of adherence with medications and regular follow-up. The patient was given clear instructions to go to ER or return to medical center if symptoms don't improve, worsen or new problems develop. The patient verbalized understanding.   Follow-up: Return in about 3 weeks (around 01/10/2018) for f/u headaches and fasting labs.   Gildardo Pounds, FNP-BC [facility] [place], [place]   12/26/2017, 8:34 PM

## 2017-12-23 ENCOUNTER — Telehealth: Payer: Self-pay | Admitting: Nurse Practitioner

## 2017-12-23 ENCOUNTER — Other Ambulatory Visit: Payer: Self-pay | Admitting: Nurse Practitioner

## 2017-12-23 DIAGNOSIS — K089 Disorder of teeth and supporting structures, unspecified: Secondary | ICD-10-CM

## 2017-12-23 NOTE — Telephone Encounter (Signed)
Referral has been placed to the dentist per patient request

## 2017-12-23 NOTE — Telephone Encounter (Signed)
Pt called to request a referral for a dentist, Pt has the [facility] card, please follow up

## 2017-12-26 ENCOUNTER — Encounter: Payer: Self-pay | Admitting: Nurse Practitioner

## 2017-12-26 DIAGNOSIS — I10 Essential (primary) hypertension: Secondary | ICD-10-CM | POA: Insufficient documentation

## 2017-12-30 ENCOUNTER — Ambulatory Visit: Payer: Self-pay | Attending: Nurse Practitioner

## 2017-12-30 DIAGNOSIS — Z0000 Encounter for general adult medical examination without abnormal findings: Secondary | ICD-10-CM | POA: Insufficient documentation

## 2017-12-30 MED FILL — LETROZOLE 2.5 MG TABLET: 2.5 | 180 days supply | Qty: 180 | Fill #0

## 2017-12-30 NOTE — Progress Notes (Signed)
Patient here for lab visit only 

## 2017-12-31 ENCOUNTER — Inpatient Hospital Stay: Payer: Self-pay

## 2017-12-31 LAB — LIPID PANEL
CHOL/HDL RATIO: 3.5 ratio (ref 0.0–4.4)
Cholesterol, Total: 244 mg/dL — ABNORMAL HIGH (ref 100–199)
HDL: 70 mg/dL (ref >39)
LDL Calculated: 158 mg/dL — ABNORMAL HIGH (ref 0–99)
Triglycerides: 80 mg/dL (ref 0–149)
VLDL Cholesterol Cal: 16 mg/dL (ref 5–40)

## 2018-01-02 MED FILL — AMOXICILLIN 875 MG TABLET: 875 | 10 days supply | Qty: 20 | Fill #0

## 2018-01-03 ENCOUNTER — Other Ambulatory Visit: Payer: Self-pay | Admitting: Nurse Practitioner

## 2018-01-03 ENCOUNTER — Telehealth: Payer: Self-pay

## 2018-01-03 DIAGNOSIS — E782 Mixed hyperlipidemia: Secondary | ICD-10-CM

## 2018-01-03 MED ORDER — ROSUVASTATIN CALCIUM 40 MG PO TABS: 40.0000 mg | ORAL_TABLET | Freq: Every evening | ORAL | 0 refills | Status: DC

## 2018-01-03 NOTE — Telephone Encounter (Signed)
CMA attempt to call patient to inform on lab results and PCP advising.  No answer and left a VM for patient.  If patient call back, please inform:  Please call patient: Tests show increased cholesterol/lipid levels.  Please make sure you taking your Crestor every evening as prescribed .  Prescription has been sent to the pharmacy.  INSTRUCTIONS: Work on a low fat, heart healthy diet and participate in regular aerobic exercise program by working out at least 150 minutes per week. No fried foods. No junk foods, sodas, sugary drinks, unhealthy snacking, alcohol or smoking.

## 2018-01-03 NOTE — Telephone Encounter (Deleted)
-----   Message from Gildardo Pounds, NP sent at 01/03/2018  8:41 AM EDT ----- Please call patient: Tests show increased cholesterol/lipid levels.  Please make sure you taking your Crestor every evening as prescribed .  Prescription has been sent to the pharmacy.  INSTRUCTIONS: Work on a low fat, heart healthy diet and participate in regular aerobic exercise program by working out at least 150 minutes per week. No fried foods. No junk foods, sodas, sugary drinks, unhealthy snacking, alcohol or smoking.

## 2018-01-14 ENCOUNTER — Other Ambulatory Visit: Payer: Self-pay | Admitting: Nurse Practitioner

## 2018-01-14 ENCOUNTER — Ambulatory Visit (HOSPITAL_COMMUNITY)
Admission: RE | Admit: 2018-01-14 | Discharge: 2018-01-14 | Disposition: A | Discharge status: Home / Self Care | Payer: Self-pay | Source: Ambulatory Visit | Attending: Nurse Practitioner | Admitting: Nurse Practitioner

## 2018-01-14 ENCOUNTER — Other Ambulatory Visit: Payer: Self-pay

## 2018-01-14 ENCOUNTER — Telehealth: Payer: Self-pay | Admitting: *Deleted

## 2018-01-14 ENCOUNTER — Ambulatory Visit: Payer: Self-pay | Admitting: Nurse Practitioner

## 2018-01-14 DIAGNOSIS — R079 Chest pain, unspecified: Secondary | ICD-10-CM | POA: Insufficient documentation

## 2018-01-14 MED FILL — ROSUVASTATIN CALCIUM 40 MG: 40 | 90 days supply | Qty: 90 | Fill #0

## 2018-01-14 NOTE — Telephone Encounter (Signed)
Pt arrived with her daughter today with multiple concerns she requested to be addressed.   She requested recheck  Lipid panel from 12/30/17. She started taking Crestor  on 01/03/18. Advised patient that repeat lab test for those levels are usually  scheduled 3- 12 months out. Medications, lifestyle and diet modifications would need at least that amount of time see improvement.  Lab results were reviewed with patient. Education provided for diet modification. Pt verbalized understanding.   C/o discomfort in left shoulder and intermittent chest pain at night x 4-5 days.Denies SOB or pain in leg joints or bilaterally in extremities. NAD noted.  VSS: P: 70, BP: 108/64, SpO2: 97% EKG done while  in office NSR, WNL.  Advised patient to go to ED if pain continues or worsens for further evaluation. She was also advised to schedule an appointment to f/u with PCP.              She request referral for ophthalmology to f/u on glaucoma. Informed it would be to the provider discretion to place referral without an office visit.  She states that her allergy medication,cetirizine (ZYRTEC) 10 MG tablet  does not work. Tried benadryl in the past but was told zyrtec was better. Request another medication because she is still having same symptoms.   C/o discomfort in left shoulder and intermittent chest pain at night. Intermittent x 4-5 days.Denies pain in leg joints or bilaterally in extremities. Discomfort in chest occurs at night x 4-5 days. Denies SOB. NAD noted.  VSS: P: 70, BP: 108/64, SpO2: 97% EKG done while  in office. EKG was WNL.  Advised patient to go to ED if pain continues or worsens.  She was also advised to schedule an appointment to f/u with PCP.

## 2018-01-14 NOTE — Telephone Encounter (Signed)
Thank you. Will address concerns at office visit.

## 2018-01-20 ENCOUNTER — Encounter (INDEPENDENT_AMBULATORY_CARE_PROVIDER_SITE_OTHER): Payer: Self-pay | Admitting: Nurse Practitioner

## 2018-01-20 ENCOUNTER — Encounter: Payer: Self-pay | Admitting: Internal Medicine

## 2018-01-20 ENCOUNTER — Ambulatory Visit (INDEPENDENT_AMBULATORY_CARE_PROVIDER_SITE_OTHER): Payer: Self-pay | Admitting: Nurse Practitioner

## 2018-01-20 ENCOUNTER — Other Ambulatory Visit: Payer: Self-pay

## 2018-01-20 VITALS — BP 116/78 | HR 69 | Temp 98.0°F | Ht 61.0 in | Wt 178.6 lb

## 2018-01-20 DIAGNOSIS — R51 Headache: Secondary | ICD-10-CM

## 2018-01-20 DIAGNOSIS — Z0000 Encounter for general adult medical examination without abnormal findings: Secondary | ICD-10-CM

## 2018-01-20 DIAGNOSIS — M25512 Pain in left shoulder: Secondary | ICD-10-CM

## 2018-01-20 DIAGNOSIS — G8929 Other chronic pain: Secondary | ICD-10-CM

## 2018-01-20 MED ORDER — MELOXICAM 7.5 MG PO TABS: 7.5000 mg | ORAL_TABLET | Freq: Every day | ORAL | 1 refills | Status: DC

## 2018-01-20 MED FILL — MELOXICAM 7.5 MG TABLET: 7.5 | 30 days supply | Qty: 30 | Fill #0

## 2018-01-20 NOTE — Patient Instructions (Addendum)
General Headache Without Cause A headache is pain or discomfort felt around the head or neck area. The specific cause of a headache may not be found. There are many causes and types of headaches. A few common ones are:  Tension headaches.  Migraine headaches.  Cluster headaches.  Chronic daily headaches.  Follow these instructions at home: Watch your condition for any changes. Take these steps to help with your condition: Managing pain  Take over-the-counter and prescription medicines only as told by your health care provider.  Lie down in a dark, quiet room when you have a headache.  If directed, apply ice to the head and neck area: ? Put ice in a plastic bag. ? Place a towel between your skin and the bag. ? Leave the ice on for 20 minutes, 2-3 times per day.  Use a heating pad or hot shower to apply heat to the head and neck area as told by your health care provider.  Keep lights dim if bright lights bother you or make your headaches worse. Eating and drinking  Eat meals on a regular schedule.  Limit alcohol use.  Decrease the amount of caffeine you drink, or stop drinking caffeine. General instructions  Keep all follow-up visits as told by your health care provider. This is important.  Keep a headache journal to help find out what may trigger your headaches. For example, write down: ? What you eat and drink. ? How much sleep you get. ? Any change to your diet or medicines.  Try massage or other relaxation techniques.  Limit stress.  Sit up straight, and do not tense your muscles.  Do not use tobacco products, including cigarettes, chewing tobacco, or e-cigarettes. If you need help quitting, ask your health care provider.  Exercise regularly as told by your health care provider.  Sleep on a regular schedule. Get 7-9 hours of sleep, or the amount recommended by your health care provider. Contact a health care provider if:  Your symptoms are not helped by  medicine.  You have a headache that is different from the usual headache.  You have nausea or you vomit.  You have a fever. Get help right away if:  Your headache becomes severe.  You have repeated vomiting.  You have a stiff neck.  You have a loss of vision.  You have problems with speech.  You have pain in the eye or ear.  You have muscular weakness or loss of muscle control.  You lose your balance or have trouble walking.  You feel faint or pass out.  You have confusion. This information is not intended to replace advice given to you by your health care provider. Make sure you discuss any questions you have with your health care provider. Document Released: 09/24/2005 Document Revised: 03/01/2016 Document Reviewed: 01/17/2015 Elsevier Interactive Patient Education  2018 [employer].  Sinus Headache A sinus headache happens when your sinuses become clogged or swollen. You may feel pain or pressure in your face, forehead, ears, or upper teeth. Sinus headaches can be mild or severe. Follow these instructions at home:  Take medicines only as told by your doctor.  If you were given an antibiotic medicine, finish all of it even if you start to feel better.  Use a nose spray if you feel stuffed up (congested).  If told, apply a warm, moist washcloth to your face to help lessen pain. Contact a doctor if:  You get headaches more than one time each week.  Light or sound bothers you.  You have a fever.  You feel sick to your stomach (nauseous) or you throw up (vomit).  Your headaches do not get better with treatment. Get help right away if:  You have trouble seeing.  You suddenly have very bad pain in your face or head.  You start to twitch or shake (seizure).  You are confused.  You have a stiff neck. This information is not intended to replace advice given to you by your health care provider. Make sure you discuss any questions you have with your health  care provider. Document Released: 01/24/2011 Document Revised: 05/20/2016 Document Reviewed: 09/20/2014 Elsevier Interactive Patient Education  2018 [place].  Glaucoma Glaucoma happens when the fluid pressure in the eyeball is too high. If the pressure stays high for too long, the eye may become damaged. This can cause a loss of vision. The most common type of glaucoma causes pressure in the eye to go up slowly. There may be no symptoms at first. Testing for this condition can help to find the condition before damage occurs. Early treatment can often stop vision loss. Follow these instructions at home:  Take medicines only as told by your doctor.  Use your eye drops exactly as told. You will probably need to use these for the rest of your life.  Exercise often. Talk with your doctor about which types of exercise are safe for you. Avoid standing on your head.  Keep all follow-up visits as told by your doctor. This is important. Contact a doctor if:  Your symptoms get worse. Get help right away if:  You have bad pain in your eye.  You have vision problems.  You have a bad headache in the area around your eye.  You feel sick to your stomach (nauseous) or you throw up (vomit).  You start to have problems with your other eye. This information is not intended to replace advice given to you by your health care provider. Make sure you discuss any questions you have with your health care provider. Document Released: 07/03/2008 Document Revised: 03/01/2016 Document Reviewed: 07/06/2014 Elsevier Interactive Patient Education  2018 [employer].  Osteoarthritis Osteoarthritis is a type of arthritis that affects tissue that covers the ends of bones in joints (cartilage). Cartilage acts as a cushion between the bones and helps them move smoothly. Osteoarthritis results when cartilage in the joints gets worn down. Osteoarthritis is sometimes called "wear and tear" arthritis. Osteoarthritis  is the most common form of arthritis. It often occurs in older people. It is a condition that gets worse over time (a progressive condition). Joints that are most often affected by this condition are in:  Fingers.  Toes.  Hips.  Knees.  Spine, including neck and lower back.  What are the causes? This condition is caused by age-related wearing down of cartilage that covers the ends of bones. What increases the risk? The following factors may make you more likely to develop this condition:  Older age.  Being overweight or obese.  Overuse of joints, such as in athletes.  Past injury of a joint.  Past surgery on a joint.  Family history of osteoarthritis.  What are the signs or symptoms? The main symptoms of this condition are pain, swelling, and stiffness in the joint. The joint may lose its shape over time. Small pieces of bone or cartilage may break off and float inside of the joint, which may cause more pain and damage to the joint.  Small deposits of bone (osteophytes) may grow on the edges of the joint. Other symptoms may include:  A grating or scraping feeling inside the joint when you move it.  Popping or creaking sounds when you move.  Symptoms may affect one or more joints. Osteoarthritis in a major joint, such as your knee or hip, can make it painful to walk or exercise. If you have osteoarthritis in your hands, you might not be able to grip items, twist your hand, or control small movements of your hands and fingers (fine motor skills). How is this diagnosed? This condition may be diagnosed based on:  Your medical history.  A physical exam.  Your symptoms.  X-rays of the affected joint(s).  Blood tests to rule out other types of arthritis.  How is this treated? There is no cure for this condition, but treatment can help to control pain and improve joint function. Treatment plans may include:  A prescribed exercise program that allows for rest and joint  relief. You may work with a physical therapist.  A weight control plan.  Pain relief techniques, such as: ? Applying heat and cold to the joint. ? Electric pulses delivered to nerve endings under the skin (transcutaneous electrical nerve stimulation, or TENS). ? Massage. ? Certain nutritional supplements.  NSAIDs or prescription medicines to help relieve pain.  Medicine to help relieve pain and inflammation (corticosteroids). This can be given by mouth (orally) or as an injection.  Assistive devices, such as a brace, wrap, splint, specialized glove, or cane.  Surgery, such as: ? An osteotomy. This is done to reposition the bones and relieve pain or to remove loose pieces of bone and cartilage. ? Joint replacement surgery. You may need this surgery if you have very bad (advanced) osteoarthritis.  Follow these instructions at home: Activity  Rest your affected joints as directed by your health care provider.  Do not drive or use heavy machinery while taking prescription pain medicine.  Exercise as directed. Your health care provider or physical therapist may recommend specific types of exercise, such as: ? Strengthening exercises. These are done to strengthen the muscles that support joints that are affected by arthritis. They can be performed with weights or with exercise bands to add resistance. ? Aerobic activities. These are exercises, such as brisk walking or water aerobics, that get your heart pumping. ? Range-of-motion activities. These keep your joints easy to move. ? Balance and agility exercises. Managing pain, stiffness, and swelling  If directed, apply heat to the affected area as often as told by your health care provider. Use the heat source that your health care provider recommends, such as a moist heat pack or a heating pad. ? If you have a removable assistive device, remove it as told by your health care provider. ? Place a towel between your skin and the heat  source. If your health care provider tells you to keep the assistive device on while you apply heat, place a towel between the assistive device and the heat source. ? Leave the heat on for 20-30 minutes. ? Remove the heat if your skin turns bright red. This is especially important if you are unable to feel pain, heat, or cold. You may have a greater risk of getting burned.  If directed, put ice on the affected joint: ? If you have a removable assistive device, remove it as told by your health care provider. ? Put ice in a plastic bag. ? Place a towel between your  skin and the bag. If your health care provider tells you to keep the assistive device on during icing, place a towel between the assistive device and the bag. ? Leave the ice on for 20 minutes, 2-3 times a day. General instructions  Take over-the-counter and prescription medicines only as told by your health care provider.  Maintain a healthy weight. Follow instructions from your health care provider for weight control. These may include dietary restrictions.  Do not use any products that contain nicotine or tobacco, such as cigarettes and e-cigarettes. These can delay bone healing. If you need help quitting, ask your health care provider.  Use assistive devices as directed by your health care provider.  Keep all follow-up visits as told by your health care provider. This is important. Where to find more information:  [employer] of Arthritis and Musculoskeletal and Skin Diseases: www.niams.SouthEx[employer].es  [employer] on Aging: http://kim-miller.com/  American College of Rheumatology: www.rheumatology.org Contact a health care provider if:  Your skin turns red.  You develop a rash.  You have pain that gets worse.  You have a fever along with joint or muscle aches. Get help right away if:  You lose a lot of weight.  You suddenly lose your appetite.  You have night sweats. Summary  Osteoarthritis is a type of  arthritis that affects tissue covering the ends of bones in joints (cartilage).  This condition is caused by age-related wearing down of cartilage that covers the ends of bones.  The main symptom of this condition is pain, swelling, and stiffness in the joint.  There is no cure for this condition, but treatment can help to control pain and improve joint function. This information is not intended to replace advice given to you by your health care provider. Make sure you discuss any questions you have with your health care provider. Document Released: 09/24/2005 Document Revised: 05/28/2016 Document Reviewed: 05/28/2016 Elsevier Interactive Patient Education  [employer].

## 2018-01-20 NOTE — Progress Notes (Signed)
Assessment & Plan:  Jill Adkins was seen today for follow-up.  Diagnoses and all orders for this visit:  Chronic nonintractable headache, unspecified headache type -     Ambulatory referral to ENT Follow up with ophthalmologist as soon as possible  Chronic left shoulder pain -     meloxicam (MOBIC) 7.5 MG tablet; Take 1 tablet (7.5 mg total) by mouth daily.   Patient has been counseled on age-appropriate routine health concerns for screening and prevention. These are reviewed and up-to-date. Referrals have been placed accordingly. Immunizations are up-to-date or declined.    Subjective:   Chief Complaint  Patient presents with  . Follow-up    headaches    HPI Jill Adkins 67 y.o. female presents to office today to follow up to headaches.  Headaches Onset of headaches was over a year ago. She denies any nausea or vomiting. She endorses watery eyes and itching in right ear and pain in the left ear. Headaches usually last a few minutes and go away on their own. They occur every 3-4 hours and are infrequent in nature. She does not drink any caffeine only green tea. She endorses increased headaches with reading. She has not seen an ophthalmologist in over year. However she does report she was diagnosed with a cataract and glaucoma and did not follow up in January as she was instructed. Recent CT showed some thickening of the sinuses.   Primary Osteoarthritis Endorses left shoulder, left hip and knee pain. She denies any trauma or injury. She is obese and does not exercise. Onset several months ago and pain is relieved with ibuprofen and tylenol but pain does return after several hours.  She is reluctant to take tylenol and ibuprofen more than once a day. Describes pain as achy. Aggravating factors:lifting arm.    Review of Systems  Constitutional: Negative for fever, malaise/fatigue and weight loss.  Eyes: Positive for blurred vision. Negative for double vision, photophobia, pain, discharge  and redness.  Respiratory: Negative.  Negative for cough and shortness of breath.   Cardiovascular: Negative.  Negative for chest pain, palpitations and leg swelling.  Gastrointestinal: Negative.  Negative for heartburn, nausea and vomiting.  Musculoskeletal: Positive for joint pain. Negative for myalgias.       SEE HPI  Neurological: Positive for headaches. Negative for dizziness, focal weakness and seizures.  Psychiatric/Behavioral: Negative.  Negative for suicidal ideas.    Past Medical History:  Diagnosis Date  . Anemia   . Arthritis   . Breast cancer ([place])    right breast  . Cataracts, bilateral   . GERD (gastroesophageal reflux disease)   . Glaucoma   . Headache   . History of anemia   . Hyperlipidemia   . Hypertension   . Seasonal allergies     Past Surgical History:  Procedure Laterality Date  . BREAST LUMPECTOMY WITH RADIOACTIVE SEED LOCALIZATION Right 04/11/2017   Procedure: RIGHT BREAST LUMPECTOMY WITH RADIOACTIVE SEED LOCALIZATION X2;  Surgeon: [name], MD;  Location: [facility];  Service: General;  Laterality: Right;    Family History  Problem Relation Age of Onset  . Hypertension Father   . Colon cancer Brother 10  . Esophageal cancer Neg Hx   . Rectal cancer Neg Hx   . Stomach cancer Neg Hx     Social History Reviewed with no changes to be made today.   Outpatient Medications Prior to Visit  Medication Sig Dispense Refill  . acetaminophen (TYLENOL) 500 MG tablet Take 2 tablets (1,000  mg total) by mouth every 6 (six) hours as needed for headache. 60 tablet 0  . amLODipine (NORVASC) 5 MG tablet Take 1 tablet (5 mg total) by mouth daily. 30 tablet 0  . cetirizine (ZYRTEC) 10 MG tablet Take 1 tablet (10 mg total) by mouth daily. 30 tablet 11  . letrozole (FEMARA) 2.5 MG tablet Take 1 tablet (2.5 mg total) by mouth daily. 180 tablet 12  . rosuvastatin (CRESTOR) 40 MG tablet Take 1 tablet (40 mg total) by mouth every evening. 90 tablet 0  . Vitamin D,  Cholecalciferol, 1000 units CAPS Take 1,000 Units by mouth 1 day or 1 dose.    . diphenhydrAMINE (BENADRYL) 25 MG tablet Take 25 mg by mouth at bedtime.     . famotidine (PEPCID) 40 MG tablet Take 1 tablet (40 mg total) by mouth 2 (two) times daily. 60 tablet 11  . amoxicillin-clavulanate (AUGMENTIN) 875-125 MG tablet Take 1 tablet by mouth 2 (two) times daily. 20 tablet 0  . baclofen (LIORESAL) 10 MG tablet Take 1 tablet (10 mg total) by mouth 3 (three) times daily. 30 each 0   Facility-Administered Medications Prior to Visit  Medication Dose Route Frequency Provider Last Rate Last Dose  . 0.9 %  sodium chloride infusion  500 mL Intravenous Continuous Pyrtle, Lajuan Lines, MD        Allergies  Allergen Reactions  . No Known Allergies   . Shrimp [Shellfish Allergy]     Headache, unable to talk       Objective:    BP 116/78 (BP Location: Right Arm, Patient Position: Sitting, Cuff Size: Large)   Pulse 69   Temp 98 F (36.7 C) (Oral)   Ht 5\' 1"  (1.549 m)   Wt 178 lb 9.6 oz (81 kg)   SpO2 96%   BMI 33.75 kg/m  Wt Readings from Last 3 Encounters:  01/20/18 178 lb 9.6 oz (81 kg)  12/20/17 179 lb 12.8 oz (81.6 kg)  12/19/17 178 lb 4.8 oz (80.9 kg)    Physical Exam  Constitutional: She is oriented to person, place, and time. She appears well-developed and well-nourished. She is cooperative.  HENT:  Head: Normocephalic and atraumatic.  Eyes: EOM are normal.  Neck: Normal range of motion.  Cardiovascular: Normal rate, regular rhythm and normal heart sounds. Exam reveals no gallop and no friction rub.  No murmur heard. Pulmonary/Chest: Effort normal and breath sounds normal. No tachypnea. No respiratory distress. She has no decreased breath sounds. She has no wheezes. She has no rhonchi. She has no rales. She exhibits no tenderness.  Abdominal: Soft. Bowel sounds are normal.  Musculoskeletal: Normal range of motion. She exhibits no edema.       Left shoulder: She exhibits pain.        Left hip: She exhibits normal range of motion and no tenderness.       Left knee: She exhibits normal range of motion and no swelling.  Neurological: She is alert and oriented to person, place, and time. Coordination normal.  Skin: Skin is warm and dry.  Psychiatric: She has a normal mood and affect. Her behavior is normal. Judgment and thought content normal.  Nursing note and vitals reviewed.        Patient has been counseled extensively about nutrition and exercise as well as the importance of adherence with medications and regular follow-up. The patient was given clear instructions to go to ER or return to medical center if symptoms don't improve, worsen  or new problems develop. The patient verbalized understanding.   Follow-up: Return in about 3 weeks (around 02/10/2018) for left shoulder and hip pain.   Gildardo Pounds, FNP-BC [facility] [place], [place]   01/20/2018, 9:24 AM

## 2018-01-21 LAB — LIPID PANEL
CHOL/HDL RATIO: 2.8 ratio (ref 0.0–4.4)
Cholesterol, Total: 180 mg/dL (ref 100–199)
HDL: 65 mg/dL (ref >39)
LDL CALC: 98 mg/dL (ref 0–99)
Triglycerides: 85 mg/dL (ref 0–149)
VLDL Cholesterol Cal: 17 mg/dL (ref 5–40)

## 2018-01-23 ENCOUNTER — Other Ambulatory Visit: Payer: Self-pay

## 2018-01-23 ENCOUNTER — Ambulatory Visit (INDEPENDENT_AMBULATORY_CARE_PROVIDER_SITE_OTHER): Payer: Self-pay | Admitting: Family Medicine

## 2018-01-23 ENCOUNTER — Encounter: Payer: Self-pay | Admitting: Family Medicine

## 2018-01-23 VITALS — BP 112/72 | HR 85 | Temp 98.7°F | Resp 16 | Ht 61.0 in | Wt 177.0 lb

## 2018-01-23 DIAGNOSIS — H04123 Dry eye syndrome of bilateral lacrimal glands: Secondary | ICD-10-CM

## 2018-01-23 DIAGNOSIS — J301 Allergic rhinitis due to pollen: Secondary | ICD-10-CM

## 2018-01-23 DIAGNOSIS — G44229 Chronic tension-type headache, not intractable: Secondary | ICD-10-CM

## 2018-01-23 MED ORDER — OLOPATADINE HCL 0.2 % OP SOLN: 1.0000 [drp] | Freq: Every day | OPHTHALMIC | 0 refills | Status: DC

## 2018-01-23 MED ORDER — FLUTICASONE PROPIONATE 50 MCG/ACT NA SUSP: 2.0000 | Freq: Every day | NASAL | 6 refills | Status: DC

## 2018-01-23 NOTE — Patient Instructions (Addendum)
   IF you received an x-ray today, you will receive an invoice from [facility]. Please contact [facility] at [phone] with questions or concerns regarding your invoice.   IF you received labwork today, you will receive an invoice from [facility]. Please contact [facility] at [phone] with questions or concerns regarding your invoice.   Our billing staff will not be able to assist you with questions regarding bills from these companies.  You will be contacted with the lab results as soon as they are available. The fastest way to get your results is to activate your My Chart account. Instructions are located on the last page of this paperwork. If you have not heard from us regarding the results in 2 weeks, please contact this office.     Postnasal Drip Postnasal drip is the feeling of mucus going down the back of your throat. Mucus is a slimy substance that moistens and cleans your nose and throat, as well as the air pockets in face bones near your forehead and cheeks (sinuses). Small amounts of mucus pass from your nose and sinuses down the back of your throat all the time. This is normal. When you produce too much mucus or the mucus gets too thick, you can feel it. Some common causes of postnasal drip include:  Having more mucus because of: ? A cold or the flu. ? Allergies. ? Cold air. ? Certain medicines.  Having more mucus that is thicker because of: ? A sinus or nasal infection. ? Dry air. ? A food allergy.  Follow these instructions at home: Relieving discomfort  Gargle with a salt-water mixture 3-4 times a day or as needed. To make a salt-water mixture, completely dissolve -1 tsp of salt in 1 cup of warm water.  If the air in your home is dry, use a humidifier to add moisture to the air.  Use a saline spray or container (neti pot) to flush out the nose (nasal irrigation). These methods can help clear away mucus and keep the nasal passages moist. General  instructions  Take over-the-counter and prescription medicines only as told by your health care provider.  Follow instructions from your health care provider about eating or drinking restrictions. You may need to avoid caffeine.  Avoid things that you know you are allergic to (allergens), like dust, mold, pollen, pets, or certain foods.  Drink enough fluid to keep your urine pale yellow.  Keep all follow-up visits as told by your health care provider. This is important. Contact a health care provider if:  You have a fever.  You have a sore throat.  You have difficulty swallowing.  You have headache.  You have sinus pain.  You have a cough that does not go away.  The mucus from your nose becomes thick and is green or yellow in color.  You have cold or flu symptoms that last more than 10 days. Summary  Postnasal drip is the feeling of mucus going down the back of your throat.  If your health care provider approves, use nasal irrigation or a nasal spray 2?4 times a day.  Avoid things that you know you are allergic to (allergens), like dust, mold, pollen, pets, or certain foods. This information is not intended to replace advice given to you by your health care provider. Make sure you discuss any questions you have with your health care provider. Document Released: 01/07/2017 Document Revised: 01/07/2017 Document Reviewed: 01/07/2017 Elsevier Interactive Patient Education  2018 Elsevier Inc.  

## 2018-01-23 NOTE — Progress Notes (Signed)
Chief Complaint  Patient presents with  . New Patient (Initial Visit)    establish care.  Allergies, face, eyes feels like grit,dry eyes, ha and using benadryl  and cetirizine for symptoms.    HPI    She went to the eye doctor this morning but no glaucoma Small cataract is developing She is taking benadryl and zyrtec for allergies She reports that her ears are itchy ears and nose Headaches as well Her headaches are like a band squeezing her head    Past Medical History:  Diagnosis Date  . Anemia   . Arthritis   . Breast cancer ([place])    right breast  . Cataracts, bilateral   . GERD (gastroesophageal reflux disease)   . Glaucoma   . Headache   . History of anemia   . Hyperlipidemia   . Hypertension   . Seasonal allergies     Current Outpatient Medications  Medication Sig Dispense Refill  . diphenhydrAMINE (BENADRYL) 25 MG tablet Take 25 mg by mouth at bedtime.     Marland Kitchen letrozole (FEMARA) 2.5 MG tablet Take 1 tablet (2.5 mg total) by mouth daily. 180 tablet 12  . rosuvastatin (CRESTOR) 40 MG tablet Take 1 tablet (40 mg total) by mouth every evening. 90 tablet 0  . Vitamin D, Cholecalciferol, 1000 units CAPS Take 1,000 Units by mouth 1 day or 1 dose.    Marland Kitchen acetaminophen (TYLENOL) 500 MG tablet Take 2 tablets (1,000 mg total) by mouth every 6 (six) hours as needed for headache. 60 tablet 1  . amLODipine (NORVASC) 5 MG tablet Take 1 tablet (5 mg total) by mouth daily. 90 tablet 1  . cetirizine (ZYRTEC) 10 MG tablet Take 1 tablet (10 mg total) by mouth daily. 90 tablet 1  . famotidine (PEPCID) 40 MG tablet Take 1 tablet (40 mg total) by mouth 2 (two) times daily. 60 tablet 11  . fluticasone (FLONASE) 50 MCG/ACT nasal spray Place 2 sprays into both nostrils daily. 16 g 6  . meloxicam (MOBIC) 15 MG tablet Take 1 tablet (15 mg total) by mouth daily. 90 tablet 1  . Olopatadine HCl 0.2 % SOLN Apply 1 drop to eye daily. 2.5 mL 0  . Propylene Glycol (SYSTANE COMPLETE OP) Apply 1 each  to eye daily.     Current Facility-Administered Medications  Medication Dose Route Frequency Provider Last Rate Last Dose  . 0.9 %  sodium chloride infusion  500 mL Intravenous Continuous Pyrtle, Lajuan Lines, MD        Allergies:  Allergies  Allergen Reactions  . No Known Allergies   . Shrimp [Shellfish Allergy]     Headache, unable to talk    Past Surgical History:  Procedure Laterality Date  . BREAST LUMPECTOMY WITH RADIOACTIVE SEED LOCALIZATION Right 04/11/2017   Procedure: RIGHT BREAST LUMPECTOMY WITH RADIOACTIVE SEED LOCALIZATION X2;  Surgeon: [name], MD;  Location: [facility];  Service: General;  Laterality: Right;    Social History   Socioeconomic History  . Marital status: Widowed    Spouse name: Not on file  . Number of children: Not on file  . Years of education: Not on file  . Highest education level: Not on file  Occupational History  . Not on file  Social Needs  . Financial resource strain: Not on file  . Food insecurity:    Worry: Not on file    Inability: Not on file  . Transportation needs:    Medical: Not on file  Non-medical: Not on file  Tobacco Use  . Smoking status: Never Smoker  . Smokeless tobacco: Never Used  Substance and Sexual Activity  . Alcohol use: No  . Drug use: No  . Sexual activity: Not Currently    Partners: Male  Lifestyle  . Physical activity:    Days per week: Not on file    Minutes per session: Not on file  . Stress: Not on file  Relationships  . Social connections:    Talks on phone: Not on file    Gets together: Not on file    Attends religious service: Not on file    Active member of club or organization: Not on file    Attends meetings of clubs or organizations: Not on file    Relationship status: Not on file  Other Topics Concern  . Not on file  Social History Narrative  . Not on file    Family History  Problem Relation Age of Onset  . Hypertension Father   . Colon cancer Brother 78  . Esophageal cancer  Neg Hx   . Rectal cancer Neg Hx   . Stomach cancer Neg Hx      ROS Review of Systems See HPI Constitution: No fevers or chills No malaise No diaphoresis Skin: No rash or itching Eyes: no blurry vision, no double vision GU: no dysuria or hematuria Neuro: no dizziness or headaches * all others reviewed and negative   Objective: Vitals:   01/23/18 1526  BP: 112/72  Pulse: 85  Resp: 16  Temp: 98.7 F (37.1 C)  TempSrc: Oral  SpO2: 97%  Weight: 177 lb (80.3 kg)  Height: 5\' 1"  (1.549 m)    Physical Exam General: alert, oriented, in NAD Head: normocephalic, atraumatic, no sinus tenderness Eyes: EOM intact, no scleral icterus or conjunctival injection Ears: TM clear bilaterally Nose: mucosa nonerythematous, nonedematous Throat: no pharyngeal exudate or erythema Lymph: no posterior auricular, submental or cervical lymph adenopathy Heart: normal rate, normal sinus rhythm, no murmurs Lungs: clear to auscultation bilaterally, no wheezing    CLINICAL DATA:  Headache for 1 year.  EXAM: CT HEAD WITHOUT CONTRAST  TECHNIQUE: Contiguous axial images were obtained from the base of the skull through the vertex without intravenous contrast.  COMPARISON:  None.  FINDINGS: Brain: No acute intracranial abnormality. Specifically, no hemorrhage, hydrocephalus, mass lesion, acute infarction, or significant intracranial injury.  Vascular: No hyperdense vessel or unexpected calcification.  Skull: Old left medial orbital wall blowout fracture. No acute calvarial abnormality.  Sinuses/Orbits: Mucosal thickening in the ethmoid air cells and right maxillary sinus. No air-fluid levels. Mastoid air cells are clear.  Other: None  IMPRESSION: No acute intracranial abnormality.   Electronically Signed   By: Rolm Baptise M.D.   On: 03/11/2017 12:43   Assessment and Plan Marciel was seen today for new patient (initial visit).  Diagnoses and all orders for this  visit:  Dry eyes Chronic tension-type headache, not intractable Seasonal allergic rhinitis due to pollen  Discussed that she is having sequelae of allergies Reviewed CT from 2018 and these headaches have not changed characters -     fluticasone (FLONASE) 50 MCG/ACT nasal spray; Place 2 sprays into both nostrils daily. -     Olopatadine HCl 0.2 % SOLN; Apply 1 drop to eye daily.     [place]

## 2018-01-27 ENCOUNTER — Other Ambulatory Visit: Payer: Self-pay | Admitting: Internal Medicine

## 2018-01-27 ENCOUNTER — Telehealth: Payer: Self-pay

## 2018-01-27 DIAGNOSIS — I10 Essential (primary) hypertension: Secondary | ICD-10-CM

## 2018-01-27 MED FILL — ?AMLODIPINE BESYLATE 5 MG T: 5 MG | 30 days supply | Qty: 30 | Fill #0

## 2018-01-27 MED FILL — ?CETIRIZINE HCL 10 MG TABLE: 10 | 30 days supply | Qty: 30 | Fill #1

## 2018-01-27 NOTE — Telephone Encounter (Signed)
CMA attempt to call patient regarding lab results.  No answer and left a VM for patient to call back.  If patient call back, please inform:  Lipid panel is normal. Continue crestor as prescribed

## 2018-01-27 NOTE — Telephone Encounter (Signed)
-----   Message from Gildardo Pounds, NP sent at 01/21/2018  8:56 AM EDT ----- Lipid panel is normal. Continue crestor as prescribed

## 2018-01-27 NOTE — Telephone Encounter (Signed)
Daughter called back regarding Vm regarding lab results   CMA inform about lab results being normal & to continue Crestor prescription   Patient daughter was aware and understood

## 2018-01-28 ENCOUNTER — Telehealth: Payer: Self-pay

## 2018-01-28 NOTE — Telephone Encounter (Signed)
Received a VM from female voice, unsure if it was pt or a family member, hard to understand due to accent.  Sounded like she had a question about upcoming appt.  Returned her call, no answer, left a VM to return our call, left our contact info.

## 2018-02-03 ENCOUNTER — Telehealth: Payer: Self-pay | Admitting: Nurse Practitioner

## 2018-02-03 NOTE — Telephone Encounter (Signed)
This would be a personal trip I am assuming and I would not write a letter stating she can leave the country for 8  Months as I will not be providing care while she is out of the country and have not instructed her to extend her stay.

## 2018-02-03 NOTE — Telephone Encounter (Signed)
Pt called to request a letter saying she can leave on June 19,2019 and be back in February 2020, [place] to extend her tickets without a charge.  She will be leaving to [place]. Please call pt when its ready for pick up, if unable to make note please follow up with pt on their next appt. [phone]

## 2018-02-05 ENCOUNTER — Telehealth: Payer: Self-pay | Admitting: Nurse Practitioner

## 2018-02-05 NOTE — Telephone Encounter (Signed)
Patient has a question regarding listed medication. Please fu at your earliest convenience. meloxicam (MOBIC) 7.5 MG tablet [[id number]]

## 2018-02-07 NOTE — Telephone Encounter (Signed)
Patient sister said she only needed a letter to extend her stay till 03/26/2018, and she can go back to her country. Due to patient have upcoming appt. With Oncology and ENT.

## 2018-02-10 ENCOUNTER — Encounter: Payer: Self-pay | Admitting: Nurse Practitioner

## 2018-02-10 NOTE — Telephone Encounter (Signed)
Letter is in chart.

## 2018-02-12 ENCOUNTER — Ambulatory Visit (INDEPENDENT_AMBULATORY_CARE_PROVIDER_SITE_OTHER): Payer: Self-pay | Admitting: Nurse Practitioner

## 2018-02-12 ENCOUNTER — Encounter

## 2018-02-12 ENCOUNTER — Encounter (INDEPENDENT_AMBULATORY_CARE_PROVIDER_SITE_OTHER): Payer: Self-pay | Admitting: Nurse Practitioner

## 2018-02-12 VITALS — BP 130/82 | HR 75 | Temp 98.4°F | Resp 18 | Ht 61.0 in | Wt 182.0 lb

## 2018-02-12 DIAGNOSIS — I10 Essential (primary) hypertension: Secondary | ICD-10-CM

## 2018-02-12 DIAGNOSIS — M25512 Pain in left shoulder: Secondary | ICD-10-CM

## 2018-02-12 DIAGNOSIS — R51 Headache: Secondary | ICD-10-CM

## 2018-02-12 DIAGNOSIS — G8929 Other chronic pain: Secondary | ICD-10-CM

## 2018-02-12 DIAGNOSIS — R519 Headache, unspecified: Secondary | ICD-10-CM

## 2018-02-12 DIAGNOSIS — K219 Gastro-esophageal reflux disease without esophagitis: Secondary | ICD-10-CM

## 2018-02-12 MED ORDER — AMLODIPINE BESYLATE 5 MG PO TABS: 5.0000 mg | ORAL_TABLET | Freq: Every day | ORAL | 1 refills | Status: DC

## 2018-02-12 MED ORDER — ACETAMINOPHEN 500 MG PO TABS: 1000.0000 mg | ORAL_TABLET | Freq: Four times a day (QID) | ORAL | 1 refills | Status: DC | PRN

## 2018-02-12 MED ORDER — FAMOTIDINE 40 MG PO TABS
40.0000 mg | ORAL_TABLET | Freq: Two times a day (BID) | ORAL | 11 refills | Status: DC
Start: 2018-02-12 — End: 2021-03-28

## 2018-02-12 MED ORDER — CETIRIZINE HCL 10 MG PO TABS: 10.0000 mg | ORAL_TABLET | Freq: Every day | ORAL | 1 refills | Status: DC

## 2018-02-12 MED ORDER — MELOXICAM 15 MG PO TABS: 15.0000 mg | ORAL_TABLET | Freq: Every day | ORAL | 2 refills | Status: DC

## 2018-02-12 MED ORDER — MELOXICAM 15 MG PO TABS: 15.0000 mg | ORAL_TABLET | Freq: Every day | ORAL | 1 refills | Status: AC

## 2018-02-12 MED FILL — ?FAMOTIDINE 40 MG TABLETS: 40 MG | 30 days supply | Qty: 60 | Fill #0

## 2018-02-12 NOTE — Telephone Encounter (Signed)
Patient came in for her OV with PCP.

## 2018-02-12 NOTE — Progress Notes (Signed)
Assessment & Plan:  Jill Adkins was seen today for pain.  Diagnoses and all orders for this visit:  Chronic left shoulder pain -     meloxicam (MOBIC) 15 MG tablet; Take 1 tablet (15 mg total) by mouth daily.  Essential hypertension -     amLODipine (NORVASC) 5 MG tablet; Take 1 tablet (5 mg total) by mouth daily. Continue all antihypertensives as prescribed.  Remember to bring in your blood pressure log with you for your follow up appointment.  DASH/Mediterranean Diets are healthier choices for HTN.    Chronic nonintractable headache, unspecified headache type -     acetaminophen (TYLENOL) 500 MG tablet; Take 2 tablets (1,000 mg total) by mouth every 6 (six) hours as needed for headache. -     cetirizine (ZYRTEC) 10 MG tablet; Take 1 tablet (10 mg total) by mouth daily. -     Ambulatory referral to Ophthalmology  Gastroesophageal reflux disease, esophagitis presence not specified -     famotidine (PEPCID) 40 MG tablet; Take 1 tablet (40 mg total) by mouth 2 (two) times daily. INSTRUCTIONS: Avoid GERD Triggers: acidic, spicy or fried foods, caffeine, coffee, sodas,  alcohol and chocolate.     Patient has been counseled on age-appropriate routine health concerns for screening and prevention. These are reviewed and up-to-date. Referrals have been placed accordingly. Immunizations are up-to-date or declined.    Subjective:   Chief Complaint  Patient presents with  . Pain   HPI Jill Adkins 67 y.o. female presents to office today for follow up to left shoulder pain.  Osteoarthritis She is currently taking meloxicam for left shoulder, hip and knee pain. Endorses minimal relief of symptoms. She would like to increase meloxicam from 7.5 mg to 15mg . I have instructed her that there is increased risk of GI bleed with increased doses. She verbalized understanding. She also continues to take acetaminophen 1000mg . Daily. Pain is worse at night.  Essential Hypertension Chronic. Stable. Blood  pressure well controlled. Taking norvasc 5mg  as prescribed. Denies chest pain, shortness of breath, palpitations, lightheadedness, dizziness, headaches or BLE edema.  BP Readings from Last 3 Encounters:  02/12/18 130/82  01/23/18 112/72  01/20/18 116/78    Headaches Headaches have improved since her last office visit however continue to re occur. She endorses a history of cataracts and glaucoma with no follow up. Will place ophthalmology referral. Taking Acetaminophen for headache relief.   Review of Systems  Constitutional: Negative for fever, malaise/fatigue and weight loss.  HENT: Negative.  Negative for nosebleeds.   Eyes: Positive for blurred vision. Negative for double vision and photophobia.  Respiratory: Negative.  Negative for cough and shortness of breath.   Cardiovascular: Negative.  Negative for chest pain, palpitations and leg swelling.  Gastrointestinal: Positive for heartburn. Negative for nausea and vomiting.  Musculoskeletal: Positive for joint pain. Negative for myalgias.       SEE HPI  Neurological: Positive for headaches. Negative for dizziness, focal weakness and seizures.  Endo/Heme/Allergies: Positive for environmental allergies.  Psychiatric/Behavioral: Negative.  Negative for suicidal ideas.    Past Medical History:  Diagnosis Date  . Anemia   . Arthritis   . Breast cancer ([place])    right breast  . Cataracts, bilateral   . GERD (gastroesophageal reflux disease)   . Glaucoma   . Headache   . History of anemia   . Hyperlipidemia   . Hypertension   . Seasonal allergies     Past Surgical History:  Procedure Laterality Date  .  BREAST LUMPECTOMY WITH RADIOACTIVE SEED LOCALIZATION Right 04/11/2017   Procedure: RIGHT BREAST LUMPECTOMY WITH RADIOACTIVE SEED LOCALIZATION X2;  Surgeon: [name], MD;  Location: [facility];  Service: General;  Laterality: Right;    Family History  Problem Relation Age of Onset  . Hypertension Father   . Colon cancer  Brother 4  . Esophageal cancer Neg Hx   . Rectal cancer Neg Hx   . Stomach cancer Neg Hx     Social History Reviewed with no changes to be made today.   Outpatient Medications Prior to Visit  Medication Sig Dispense Refill  . diphenhydrAMINE (BENADRYL) 25 MG tablet Take 25 mg by mouth at bedtime.     . fluticasone (FLONASE) 50 MCG/ACT nasal spray Place 2 sprays into both nostrils daily. 16 g 6  . letrozole (FEMARA) 2.5 MG tablet Take 1 tablet (2.5 mg total) by mouth daily. 180 tablet 12  . Olopatadine HCl 0.2 % SOLN Apply 1 drop to eye daily. 2.5 mL 0  . Propylene Glycol (SYSTANE COMPLETE OP) Apply 1 each to eye daily.    . rosuvastatin (CRESTOR) 40 MG tablet Take 1 tablet (40 mg total) by mouth every evening. 90 tablet 0  . Vitamin D, Cholecalciferol, 1000 units CAPS Take 1,000 Units by mouth 1 day or 1 dose.    Marland Kitchen acetaminophen (TYLENOL) 500 MG tablet Take 2 tablets (1,000 mg total) by mouth every 6 (six) hours as needed for headache. 60 tablet 0  . amLODipine (NORVASC) 5 MG tablet TAKE 1 TABLET BY MOUTH DAILY. 30 tablet 0  . cetirizine (ZYRTEC) 10 MG tablet Take 1 tablet (10 mg total) by mouth daily. 30 tablet 11  . famotidine (PEPCID) 40 MG tablet Take 1 tablet (40 mg total) by mouth 2 (two) times daily. 60 tablet 11  . meloxicam (MOBIC) 7.5 MG tablet Take 1 tablet (7.5 mg total) by mouth daily. 30 tablet 1   Facility-Administered Medications Prior to Visit  Medication Dose Route Frequency Provider Last Rate Last Dose  . 0.9 %  sodium chloride infusion  500 mL Intravenous Continuous Pyrtle, Lajuan Lines, MD        Allergies  Allergen Reactions  . No Known Allergies   . Shrimp [Shellfish Allergy]     Headache, unable to talk       Objective:    BP 130/82 (BP Location: Right Arm, Patient Position: Sitting, Cuff Size: Large)   Pulse 75   Temp 98.4 F (36.9 C) (Oral)   Resp 18   Ht 5\' 1"  (1.549 m)   Wt 182 lb (82.6 kg)   SpO2 96%   BMI 34.39 kg/m  Wt Readings from Last 3  Encounters:  02/12/18 182 lb (82.6 kg)  01/23/18 177 lb (80.3 kg)  01/20/18 178 lb 9.6 oz (81 kg)    Physical Exam  Constitutional: She is oriented to person, place, and time. She appears well-developed and well-nourished. She is cooperative.  HENT:  Head: Normocephalic and atraumatic.  Nose: Mucosal edema and rhinorrhea present.  Eyes: EOM are normal.  Neck: Normal range of motion.  Cardiovascular: Normal rate, regular rhythm and normal heart sounds. Exam reveals no gallop and no friction rub.  No murmur heard. Pulmonary/Chest: Effort normal and breath sounds normal. No tachypnea. No respiratory distress. She has no decreased breath sounds. She has no wheezes. She has no rhonchi. She has no rales. She exhibits no tenderness.  Abdominal: Soft. Bowel sounds are normal.  Musculoskeletal: Normal range of motion.  She exhibits no edema.       Left shoulder: She exhibits pain.  Neurological: She is alert and oriented to person, place, and time. Coordination normal.  Skin: Skin is warm and dry.  Psychiatric: She has a normal mood and affect. Her behavior is normal. Judgment and thought content normal.  Nursing note and vitals reviewed.        Patient has been counseled extensively about nutrition and exercise as well as the importance of adherence with medications and regular follow-up. The patient was given clear instructions to go to ER or return to medical center if symptoms don't improve, worsen or new problems develop. The patient verbalized understanding.   Follow-up: Return in about 6 months (around 08/15/2018).   Gildardo Pounds, FNP-BC [facility], [place]   02/12/2018, 8:42 PM

## 2018-02-12 NOTE — Patient Instructions (Signed)

## 2018-02-13 ENCOUNTER — Ambulatory Visit (INDEPENDENT_AMBULATORY_CARE_PROVIDER_SITE_OTHER): Payer: Self-pay | Admitting: Nurse Practitioner

## 2018-02-14 ENCOUNTER — Encounter: Payer: Self-pay | Admitting: Internal Medicine

## 2018-02-18 MED FILL — MELOXICAM 15 MG TABLET: 15 | 30 days supply | Qty: 30 | Fill #0

## 2018-02-20 ENCOUNTER — Ambulatory Visit: Payer: Self-pay

## 2018-02-20 ENCOUNTER — Ambulatory Visit (INDEPENDENT_AMBULATORY_CARE_PROVIDER_SITE_OTHER): Payer: Self-pay | Admitting: Otolaryngology

## 2018-02-20 ENCOUNTER — Other Ambulatory Visit: Payer: Self-pay

## 2018-02-20 VITALS — Ht 60.0 in | Wt 174.0 lb

## 2018-02-20 DIAGNOSIS — J310 Chronic rhinitis: Secondary | ICD-10-CM

## 2018-02-20 DIAGNOSIS — J343 Hypertrophy of nasal turbinates: Secondary | ICD-10-CM

## 2018-02-20 DIAGNOSIS — H608X3 Other otitis externa, bilateral: Secondary | ICD-10-CM

## 2018-02-20 DIAGNOSIS — R51 Headache: Secondary | ICD-10-CM

## 2018-02-20 DIAGNOSIS — K219 Gastro-esophageal reflux disease without esophagitis: Secondary | ICD-10-CM

## 2018-02-20 MED FILL — PREDNISOLONE AC 1% EYE DROP: 1 | 18 days supply | Qty: 10 | Fill #0

## 2018-02-20 MED FILL — ?CETIRIZINE HCL 10 MG TABLE: 10 | 30 days supply | Qty: 30 | Fill #0

## 2018-02-20 MED FILL — AMLODIPINE BESYLATE 5 MG TA: 5 | 30 days supply | Qty: 30 | Fill #0

## 2018-02-20 NOTE — Progress Notes (Signed)
Denies allergies to eggs or soy products. Denies complication of anesthesia or sedation. Denies use of weight loss medication. Denies use of O2.   Emmi instructions declined.   Pre-Visit was conducted with the interpreter [name] from language resources.

## 2018-02-25 ENCOUNTER — Ambulatory Visit (INDEPENDENT_AMBULATORY_CARE_PROVIDER_SITE_OTHER): Payer: Self-pay | Admitting: Nurse Practitioner

## 2018-03-07 ENCOUNTER — Other Ambulatory Visit: Payer: Self-pay | Admitting: Nurse Practitioner

## 2018-03-07 DIAGNOSIS — E782 Mixed hyperlipidemia: Secondary | ICD-10-CM

## 2018-03-07 MED FILL — MELOXICAM 15 MG TABLET: 15 | 90 days supply | Qty: 90 | Fill #1

## 2018-03-07 MED FILL — AMLODIPINE BESYLATE 5 MG TA: 5 | 90 days supply | Qty: 90 | Fill #1

## 2018-03-10 ENCOUNTER — Encounter: Payer: Self-pay | Admitting: Internal Medicine

## 2018-03-10 ENCOUNTER — Ambulatory Visit (AMBULATORY_SURGERY_CENTER): Payer: Self-pay | Admitting: Internal Medicine

## 2018-03-10 VITALS — BP 149/80 | HR 75 | Temp 98.4°F | Resp 20 | Ht 60.0 in | Wt 174.0 lb

## 2018-03-10 DIAGNOSIS — K2970 Gastritis, unspecified, without bleeding: Secondary | ICD-10-CM

## 2018-03-10 DIAGNOSIS — K3189 Other diseases of stomach and duodenum: Secondary | ICD-10-CM

## 2018-03-10 DIAGNOSIS — K2950 Unspecified chronic gastritis without bleeding: Secondary | ICD-10-CM

## 2018-03-10 MED ORDER — SODIUM CHLORIDE 0.9 % IV SOLN: 500.0000 mL | Freq: Once | INTRAVENOUS | Status: DC

## 2018-03-10 MED FILL — ROSUVASTATIN CALCIUM 40 MG: 40 | 30 days supply | Qty: 30 | Fill #0

## 2018-03-10 NOTE — Progress Notes (Signed)
Called to room to assist during endoscopic procedure.  Patient ID and intended procedure confirmed with present staff. Received instructions for my participation in the procedure from the performing physician.  

## 2018-03-10 NOTE — Op Note (Signed)
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 The patient's tolerance of                            previous anesthesia was also reviewed. The risks                            and benefits of the procedure and the sedation                            options and risks were discussed with the patient.                            All questions were answered, and informed consent                            was obtained. Prior Anticoagulants: The patient has                            taken no previous anticoagulant or antiplatelet                            agents. ASA Grade Assessment: II - A patient with                            mild systemic disease. After reviewing the risks                            and benefits, the patient was deemed in                            satisfactory condition to undergo the procedure.  After obtaining informed consent, the endoscope was                            passed under direct vision. Throughout the                            procedure, the patient's blood pressure, pulse, and                            oxygen saturations were monitored continuously. The                            Model GIF-HQ190 [id number]) scope was introduced                            through the mouth, and  advanced to the second part                            of duodenum. The upper GI endoscopy was                            accomplished without difficulty. The patient                            tolerated the procedure well. Scope In: Scope Out: Findings:                 Normal mucosa was found in the entire esophagus.                           A small hiatal hernia was present.                           Scattered moderate inflammation characterized by                            erythema, granularity and patches of atrophic                            mucosa was found at the incisura, in the gastric                            antrum and in the prepyloric region of the stomach.                            Multiple biopsies were obtained with cold forceps                            for histology in a targeted manner in the gastric                            body, at the incisura and in the gastric antrum.  Normal mucosa was found in the cardia, in the                            gastric fundus and in the gastric body.                           The examined duodenum was normal. Complications:            No immediate complications. Estimated Blood Loss:     Estimated blood loss was minimal. Impression:               - Normal mucosa was found in the entire esophagus.                           - Small hiatal hernia.                           - Chronic atrophic gastritis.                           - Normal mucosa was found in the cardia, in the                            gastric fundus and in the gastric body.                           - Normal examined duodenum.                           - Multiple biopsies were obtained in the gastric                            body, at the incisura and in the gastric antrum. Recommendation:           - Patient has a contact number available for                            emergencies. The signs and symptoms of potential                             delayed complications were discussed with the                            patient. Return to normal activities tomorrow.                            Written discharge instructions were provided to the                            patient.                           - Resume previous diet.                           - Continue present medications.                           -  Await pathology results.                           - Repeat upper endoscopy for surveillance based on                            pathology results. Jerene Bears, MD 03/10/2018 10:19:48 AM This report has been signed electronically.

## 2018-03-10 NOTE — Patient Instructions (Signed)
Impression/Recommendations:  Hiatal hernia handout given to patient. Gastritis handout given to patient.  Resume previous diet. Continue present medications.  Repeat upper endoscopy for surveillance based on pathology results.  YOU HAD AN ENDOSCOPIC PROCEDURE TODAY AT [facility]:   Refer to the procedure report that was given to you for any specific questions about what was found during the examination.  If the procedure report does not answer your questions, please call your gastroenterologist to clarify.  If you requested that your care partner not be given the details of your procedure findings, then the procedure report has been included in a sealed envelope for you to review at your convenience later.  YOU SHOULD EXPECT: Some feelings of bloating in the abdomen. Passage of more gas than usual.  Walking can help get rid of the air that was put into your GI tract during the procedure and reduce the bloating. If you had a lower endoscopy (such as a colonoscopy or flexible sigmoidoscopy) you may notice spotting of blood in your stool or on the toilet paper. If you underwent a bowel prep for your procedure, you may not have a normal bowel movement for a few days.  Please Note:  You might notice some irritation and congestion in your nose or some drainage.  This is from the oxygen used during your procedure.  There is no need for concern and it should clear up in a day or so.  SYMPTOMS TO REPORT IMMEDIATELY:   Following upper endoscopy (EGD)  Vomiting of blood or coffee ground material  New chest pain or pain under the shoulder blades  Painful or persistently difficult swallowing  New shortness of breath  Fever of 100F or higher  Black, tarry-looking stools  For urgent or emergent issues, a gastroenterologist can be reached at any hour by calling [phone].   DIET:  We do recommend a small meal at first, but then you may proceed to your regular diet.  Drink plenty  of fluids but you should avoid alcoholic beverages for 24 hours.  ACTIVITY:  You should plan to take it easy for the rest of today and you should NOT DRIVE or use heavy machinery until tomorrow (because of the sedation medicines used during the test).    FOLLOW UP: Our staff will call the number listed on your records the next business day following your procedure to check on you and address any questions or concerns that you may have regarding the information given to you following your procedure. If we do not reach you, we will leave a message.  However, if you are feeling well and you are not experiencing any problems, there is no need to return our call.  We will assume that you have returned to your regular daily activities without incident.  If any biopsies were taken you will be contacted by phone or by letter within the next 1-3 weeks.  Please call us at [phone] if you have not heard about the biopsies in 3 weeks.    SIGNATURES/CONFIDENTIALITY: You and/or your care partner have signed paperwork which will be entered into your electronic medical record.  These signatures attest to the fact that that the information above on your After Visit Summary has been reviewed and is understood.  Full responsibility of the confidentiality of this discharge information lies with you and/or your care-partner.

## 2018-03-10 NOTE — Progress Notes (Signed)
To PACU, VSS. Report to [email or web address] 

## 2018-03-10 NOTE — Progress Notes (Signed)
Pt's states no medical or surgical changes since previsit or office visit. Also interpreter did not come but but patient understands [place].

## 2018-03-11 ENCOUNTER — Telehealth: Payer: Self-pay | Admitting: *Deleted

## 2018-03-11 NOTE — Telephone Encounter (Signed)
  Follow up Call-  Call back number 03/10/2018 03/21/2017  Post procedure Call Back phone  # [phone] cell ok to speak to her. [phone] [name] - pt's daughter  Permission to leave phone message Yes Yes  Some recent data might be hidden     Patient questions:  Message left to call us if necessary.  Second call.

## 2018-03-11 NOTE — Telephone Encounter (Signed)
  Follow up Call-  Call back number 03/10/2018 03/21/2017  Post procedure Call Back phone  # [phone] cell ok to speak to her. [phone] Jill Adkins - pt's daughter  Permission to leave phone message Yes Yes  Some recent data might be hidden     Patient questions:  Message left to call us if necessary.

## 2018-03-17 ENCOUNTER — Encounter: Payer: Self-pay | Admitting: Internal Medicine

## 2018-03-24 ENCOUNTER — Encounter: Payer: Self-pay | Admitting: Internal Medicine

## 2018-03-25 ENCOUNTER — Other Ambulatory Visit: Payer: Self-pay

## 2018-03-25 ENCOUNTER — Ambulatory Visit: Payer: Self-pay | Admitting: Oncology

## 2018-04-03 ENCOUNTER — Telehealth: Payer: Self-pay | Admitting: Nurse Practitioner

## 2018-04-03 NOTE — Telephone Encounter (Signed)
Patient's daughter called wanting to ask PCP if it is okay for patient to take Mental Focus Supplements OTC with all the current medication she is currently taking.

## 2018-04-06 NOTE — Telephone Encounter (Signed)
I would not recommend any supplements outside of vitamin B, B complex or Multivitamin at this time. We can discuss at her next office visit.

## 2018-06-17 MED FILL — LETROZOLE 2.5 MG TABLET: 2.5 | 90 days supply | Qty: 90 | Fill #1

## 2018-06-17 MED FILL — AMLODIPINE BESYLATE 5 MG TA: 5 | 60 days supply | Qty: 60 | Fill #2

## 2018-06-17 MED FILL — FAMOTIDINE 40 MG TABS: 40 | 90 days supply | Qty: 180 | Fill #1

## 2018-10-23 ENCOUNTER — Other Ambulatory Visit (INDEPENDENT_AMBULATORY_CARE_PROVIDER_SITE_OTHER): Payer: Self-pay | Admitting: Nurse Practitioner

## 2018-10-23 DIAGNOSIS — I10 Essential (primary) hypertension: Secondary | ICD-10-CM

## 2018-10-27 MED FILL — AMLODIPINE BESYLATE 5 MG TA: 5 | 30 days supply | Qty: 30 | Fill #0

## 2018-12-04 ENCOUNTER — Inpatient Hospital Stay: Payer: Self-pay | Admitting: Oncology

## 2018-12-31 ENCOUNTER — Ambulatory Visit: Payer: Self-pay | Admitting: Nurse Practitioner

## 2019-01-02 ENCOUNTER — Telehealth: Payer: Self-pay | Admitting: *Deleted

## 2019-01-02 NOTE — Telephone Encounter (Signed)
This RN received VM from the patient's daughter stating pt is out of letrozole and is unable to return to the [place] per Covid 19 pandemic travel restrictions.  This RN returned call and obtained identified VM- message left recommendation is for the patient to continue medication.  Prescription can be refilled and daughter can mail to patient.  This RN's name and phone number left for [name] to return call.

## 2019-01-06 ENCOUNTER — Ambulatory Visit: Payer: Self-pay | Admitting: Oncology

## 2019-01-09 ENCOUNTER — Other Ambulatory Visit: Payer: Self-pay | Admitting: *Deleted

## 2019-01-09 MED ORDER — LETROZOLE 2.5 MG PO TABS: 2.5000 mg | ORAL_TABLET | Freq: Every day | ORAL | 12 refills | Status: DC

## 2019-01-10 MED FILL — LETROZOLE 2.5 MG TABS: 2.5 | 30 days supply | Qty: 30 | Fill #0

## 2019-02-03 ENCOUNTER — Ambulatory Visit: Payer: Self-pay | Admitting: Internal Medicine

## 2019-03-03 ENCOUNTER — Other Ambulatory Visit (INDEPENDENT_AMBULATORY_CARE_PROVIDER_SITE_OTHER): Payer: Self-pay | Admitting: Nurse Practitioner

## 2019-03-03 DIAGNOSIS — I10 Essential (primary) hypertension: Secondary | ICD-10-CM

## 2019-03-03 MED FILL — LETROZOLE 2.5 MG TABLET: 2.5 | 90 days supply | Qty: 90 | Fill #1

## 2019-03-04 ENCOUNTER — Other Ambulatory Visit (INDEPENDENT_AMBULATORY_CARE_PROVIDER_SITE_OTHER): Payer: Self-pay | Admitting: Nurse Practitioner

## 2019-03-04 ENCOUNTER — Telehealth: Payer: Self-pay | Admitting: Nurse Practitioner

## 2019-03-04 DIAGNOSIS — I10 Essential (primary) hypertension: Secondary | ICD-10-CM

## 2019-03-04 NOTE — Telephone Encounter (Signed)
Pt daughter came in requesting a refill on the fo.ling medication, famotidine, amLODipine.the patient is in her home country and isnt able to come back due to covid-19 please refill if appropriate and pt uses [facility] pharmacy

## 2019-03-04 NOTE — Telephone Encounter (Signed)
Will route to PCP 

## 2019-03-10 ENCOUNTER — Other Ambulatory Visit: Payer: Self-pay | Admitting: Nurse Practitioner

## 2019-03-10 ENCOUNTER — Other Ambulatory Visit (INDEPENDENT_AMBULATORY_CARE_PROVIDER_SITE_OTHER): Payer: Self-pay | Admitting: Nurse Practitioner

## 2019-03-10 DIAGNOSIS — I10 Essential (primary) hypertension: Secondary | ICD-10-CM

## 2019-03-10 MED ORDER — AMLODIPINE BESYLATE 5 MG PO TABS: 5.0000 mg | ORAL_TABLET | Freq: Every day | ORAL | 1 refills | Status: DC

## 2019-03-10 MED ORDER — OMEPRAZOLE 20 MG PO CPDR: 20.0000 mg | DELAYED_RELEASE_CAPSULE | Freq: Every day | ORAL | 3 refills | Status: DC

## 2019-03-11 ENCOUNTER — Telehealth: Payer: Self-pay | Admitting: Nurse Practitioner

## 2019-03-11 MED FILL — ?AMLODIPINE BESYLATE 5MG TA: 5 | 60 days supply | Qty: 60 | Fill #0

## 2019-03-11 MED FILL — ?OMEPRAZOLE 20 MG CAPSULE D: 20 | 90 days supply | Qty: 90 | Fill #0

## 2019-03-11 NOTE — Telephone Encounter (Signed)
1) Medication(s) Requested (by name): Famotidine Was told that the medication is no longer available  2) Pharmacy of Choice: [facility] 3) Special Requests:   Approved medications will be sent to the pharmacy, we will reach out if there is an issue.  Requests made after 3pm may not be addressed until the following business day!  If a patient is unsure of the name of the medication(s) please note and ask patient to call back when they are able to provide all info, do not send to responsible party until all information is available!

## 2019-03-12 ENCOUNTER — Other Ambulatory Visit: Payer: Self-pay | Admitting: Pharmacist

## 2019-03-12 DIAGNOSIS — I10 Essential (primary) hypertension: Secondary | ICD-10-CM

## 2019-03-12 MED ORDER — AMLODIPINE BESYLATE 5 MG PO TABS: 5.0000 mg | ORAL_TABLET | Freq: Every day | ORAL | 0 refills | Status: DC

## 2019-03-12 NOTE — Telephone Encounter (Signed)
Patient's PCP already sent in Prilosec.

## 2019-06-11 MED FILL — ?AMLODIPINE BESYLATE 5MG TA: 5 | 30 days supply | Qty: 30 | Fill #0

## 2019-06-11 MED FILL — ?OMEPRAZOLE 20MG CAP DR: 20 | 30 days supply | Qty: 30 | Fill #1

## 2019-06-11 MED FILL — LETROZOLE 2.5 MG TABS: 2.5 | 90 days supply | Qty: 90 | Fill #2

## 2019-09-09 ENCOUNTER — Other Ambulatory Visit: Payer: Self-pay | Admitting: Nurse Practitioner

## 2019-09-09 MED FILL — LETROZOLE 2.5 MG TABS: 2.5 | 90 days supply | Qty: 90 | Fill #3

## 2019-09-11 ENCOUNTER — Other Ambulatory Visit: Payer: Self-pay | Admitting: Nurse Practitioner

## 2019-09-21 ENCOUNTER — Other Ambulatory Visit: Payer: Self-pay | Admitting: Nurse Practitioner

## 2019-09-21 MED FILL — LETROZOLE 2.5 MG TABS: 2.5 | 90 days supply | Qty: 90 | Fill #3

## 2020-01-26 ENCOUNTER — Telehealth: Payer: Self-pay | Admitting: *Deleted

## 2020-01-26 ENCOUNTER — Other Ambulatory Visit: Payer: Self-pay | Admitting: Oncology

## 2020-01-26 MED FILL — LETROZOLE 2.5 MG TABS: 2.5 | 30 days supply | Qty: 30 | Fill #0

## 2020-01-26 NOTE — Telephone Encounter (Signed)
Per discussion with pt's daughter - [name]- mother is in [place] and unable to travel per Covid restrictions.  Refill of antiestrogen verified with MD and refilled. [name] will let us know when pt returns to area for scheduling of follow up visit.

## 2020-01-28 ENCOUNTER — Telehealth: Payer: Self-pay | Admitting: Nurse Practitioner

## 2020-01-28 NOTE — Telephone Encounter (Signed)
1) Medication(s) Requested (by name): amLODipine (NORVASC) 5 MG tablet    2) Pharmacy of Choice: [facility]   3) Special Requests: Patient has been out of the country and not able to come over because the border is closed.    Approved medications will be sent to the pharmacy, we will reach out if there is an issue.  Requests made after 3pm may not be addressed until the following business day!  If a patient is unsure of the name of the medication(s) please note and ask patient to call back when they are able to provide all info, do not send to responsible party until all information is available!

## 2020-01-28 NOTE — Telephone Encounter (Signed)
Patient has not been seen since 2019 and has no planned appointment. She must be seen.

## 2020-03-21 ENCOUNTER — Encounter: Payer: Self-pay | Admitting: *Deleted

## 2020-03-22 ENCOUNTER — Telehealth: Payer: Self-pay | Admitting: *Deleted

## 2020-03-22 NOTE — Telephone Encounter (Signed)
This RN spoke with the patients daughterLelan Pons, per her call stating she is trying to get help with getting her mother back to this area for her medical follow up.  She is hoping to get a letter to assist with obtaining a visa.  Jaylanni is currently in [place] but due to the Covid 19 pandemic cannot get a visa due to the borders closed in [place].  [name] states her pt is having complaints of breast discomfort - including warmth and tenderness suggestive of cellulitis.  This RN informed [name] letter will be provided but also that per above description her mother may have a localized infection and should see a health provider asap in her current area for evaluation.  [name] verbalized understanding.  Letter obtained and put in mail post verifying Jill Adkins's address.

## 2020-05-26 MED FILL — LETROZOLE 2.5 MG TABS: 2.5 | 90 days supply | Qty: 90 | Fill #1

## 2020-12-16 MED FILL — LETROZOLE 2.5 MG TABS: 2.5 | 90 days supply | Qty: 90 | Fill #2

## 2021-01-11 ENCOUNTER — Other Ambulatory Visit: Payer: Self-pay

## 2021-01-11 MED FILL — Letrozole Tab 2.5 MG: ORAL | 30 days supply | Qty: 30 | Fill #0 | Status: AC

## 2021-01-20 ENCOUNTER — Telehealth: Payer: Self-pay

## 2021-01-20 NOTE — Telephone Encounter (Signed)
Patient's daughter, [name], called to inform that her mom will be back in the states on Monday 4/18.    Daughter requesting to get follow up with MD.  Daughter reports patient has "a cyst" to right breast.  Denies any redness, or streaking.  Per daughter this "cyst" comes and go.    RN notified daughter that we will have MD review schedule and be back in contact with recommendations/appointmnet.

## 2021-01-23 ENCOUNTER — Other Ambulatory Visit: Payer: Self-pay | Admitting: Oncology

## 2021-01-23 ENCOUNTER — Telehealth: Payer: Self-pay | Admitting: Oncology

## 2021-01-23 ENCOUNTER — Other Ambulatory Visit: Payer: Self-pay

## 2021-01-23 DIAGNOSIS — C50311 Malignant neoplasm of lower-inner quadrant of right female breast: Secondary | ICD-10-CM

## 2021-01-23 DIAGNOSIS — Z170 Estrogen receptor positive status [ER+]: Secondary | ICD-10-CM

## 2021-01-23 MED ORDER — LETROZOLE 2.5 MG PO TABS
ORAL_TABLET | Freq: Every day | ORAL | 12 refills | Status: DC
  Filled 2021-01-23: qty 90, fill #0

## 2021-01-23 NOTE — Telephone Encounter (Signed)
Scheduled appt per 4/18 sch msg. Called pt using interpreter line, no answer. Left msg with appt date and time.

## 2021-01-24 ENCOUNTER — Telehealth: Payer: Self-pay

## 2021-01-24 NOTE — Telephone Encounter (Signed)
Per MD recommendations - Orders placed for Bilateral diagnostic mammogram, successfully faxed to [facility].    Patient is scheduled for follow up with MD.   RN left voicemail with daughter, [name], to call back to confirm MD recommendations.

## 2021-01-24 NOTE — Telephone Encounter (Signed)
RN notified patient's daughter of MD recommendations.  No further needs at this time.

## 2021-01-30 ENCOUNTER — Telehealth: Payer: Self-pay

## 2021-01-30 NOTE — Telephone Encounter (Signed)
Pt's daughter [name] called to inform us she tried to schedule appt for pt at [facility], but [facility] reported they did not receive orders. Orders have been refaxed to [facility]. Fax Conf. Received.

## 2021-01-31 ENCOUNTER — Other Ambulatory Visit: Payer: Self-pay

## 2021-01-31 DIAGNOSIS — D0511 Intraductal carcinoma in situ of right breast: Secondary | ICD-10-CM

## 2021-01-31 NOTE — Progress Notes (Signed)
Corrected order faxed to [facility]. Confirmed with Janett Billow at [facility].

## 2021-02-08 ENCOUNTER — Encounter (INDEPENDENT_AMBULATORY_CARE_PROVIDER_SITE_OTHER): Payer: Self-pay

## 2021-02-08 ENCOUNTER — Ambulatory Visit: Payer: Self-pay | Admitting: Physician Assistant

## 2021-02-08 ENCOUNTER — Other Ambulatory Visit: Payer: Self-pay

## 2021-02-08 ENCOUNTER — Other Ambulatory Visit: Payer: Self-pay | Admitting: Pharmacist

## 2021-02-08 VITALS — BP 109/65 | HR 81 | Temp 98.2°F | Resp 18 | Ht 59.84 in | Wt 173.0 lb

## 2021-02-08 DIAGNOSIS — I10 Essential (primary) hypertension: Secondary | ICD-10-CM

## 2021-02-08 DIAGNOSIS — R42 Dizziness and giddiness: Secondary | ICD-10-CM

## 2021-02-08 DIAGNOSIS — L02421 Furuncle of right axilla: Secondary | ICD-10-CM

## 2021-02-08 DIAGNOSIS — Z13220 Encounter for screening for lipoid disorders: Secondary | ICD-10-CM

## 2021-02-08 MED ORDER — MUPIROCIN 2 % EX OINT
1.0000 "application " | TOPICAL_OINTMENT | Freq: Two times a day (BID) | CUTANEOUS | 0 refills | Status: DC
  Filled 2021-02-08: qty 22, 11d supply, fill #0

## 2021-02-08 MED ORDER — MUPIROCIN CALCIUM 2 % EX CREA
1.0000 | TOPICAL_CREAM | Freq: Two times a day (BID) | CUTANEOUS | 0 refills | Status: DC
Start: 2021-02-08 — End: 2021-02-08
  Filled 2021-02-08: qty 15, 8d supply, fill #0

## 2021-02-08 NOTE — Patient Instructions (Signed)
For your dizziness, I encourage you to increase your water intake, check your blood pressure on a daily basis, and check it again when you have the feeling of dizziness.  Write these readings down and bring them to the mobile unit next week for further review.  Please return to the mobile unit in the morning for fasting labs.  For the boils under your arm, you will use antibiotic cream twice a day, and use warm compresses.  Make sure to keep the area clean and dry.  Jill Rad, PA-C Physician Assistant [place] [email or web address]    Hypotension As your heart beats, it forces blood through your body. Hypotension, commonly called low blood pressure, is when the force of blood pumping through your arteries is too weak. Arteries are blood vessels that carry blood from the heart throughout the body. Depending on the cause and severity, hypotension may be harmless (benign) or may cause serious problems (be critical). When blood pressure is too low, you may not get enough blood to your brain or to the rest of your organs. This can cause weakness, light-headedness, rapid heartbeat, and fainting. What are the causes? This condition may be caused by:  Blood loss.  Loss of body fluids (dehydration).  Heart problems.  Hormone (endocrine) problems.  Pregnancy.  Severe infection.  Lack of certain nutrients.  Severe allergic reactions (anaphylaxis).  Certain medicines, such as blood pressure medicine or medicines that make the body lose excess fluids (diuretics). Sometimes, hypotension may be caused by not taking medicine as directed, such as taking too much of a certain medicine. What increases the risk? The following factors may make you more likely to develop this condition:  Age. Risk increases as you get older.  Conditions that affect the heart or the central nervous system.  Taking certain medicines, such as blood  pressure medicine or diuretics.  Being pregnant. What are the signs or symptoms? Common symptoms of this condition include:  Weakness.  Light-headedness.  Dizziness.  Blurred vision.  Fatigue.  Rapid heartbeat.  Fainting, in severe cases. How is this diagnosed? This condition is diagnosed based on:  Your medical history.  Your symptoms.  Your blood pressure measurement. Your health care provider will check your blood pressure when you are: ? Lying down. ? Sitting. ? Standing. A blood pressure reading is recorded as two numbers, such as "120 over 80" (or 120/80). The first ("top") number is called the systolic pressure. It is a measure of the pressure in your arteries as your heart beats. The second ("bottom") number is called the diastolic pressure. It is a measure of the pressure in your arteries when your heart relaxes between beats. Blood pressure is measured in a unit called mm Hg. Healthy blood pressure for most adults is 120/80. If your blood pressure is below 90/60, you may be diagnosed with hypotension. Other information or tests that may be used to diagnose hypotension include:  Your other vital signs, such as your heart rate and temperature.  Blood tests.  Tilt table test. For this test, you will be safely secured to a table that moves you from a lying position to an upright position. Your heart rhythm and blood pressure will be monitored during the test. How is this treated? Treatment for this condition may include:  Changing your diet. This may involve eating more salt (sodium) or drinking more water.  Taking medicines to raise your blood pressure.  Changing the dosage of certain medicines you are taking  that might be lowering your blood pressure.  Wearing compression stockings. These stockings help to prevent blood clots and reduce swelling in your legs. In some cases, you may need to go to the hospital for:  Fluid replacement. This means you will  receive fluids through an IV.  Blood replacement. This means you will receive donated blood through an IV (transfusion).  Treating an infection or heart problems, if this applies.  Monitoring. You may need to be monitored while medicines that you are taking wear off. Follow these instructions at home: Eating and drinking  Drink enough fluid to keep your urine pale yellow.  Eat a healthy diet, and follow instructions from your health care provider about eating or drinking restrictions. A healthy diet includes: ? Fresh fruits and vegetables. ? Whole grains. ? Lean meats. ? Low-fat dairy products.  Eat extra salt only as directed. Do not add extra salt to your diet unless your health care provider told you to do that.  Eat frequent, small meals.  Avoid standing up suddenly after eating.   Medicines  Take over-the-counter and prescription medicines only as told by your health care provider. ? Follow instructions from your health care provider about changing the dosage of your current medicines, if this applies. ? Do not stop or adjust any of your medicines on your own. General instructions  Wear compression stockings as told by your health care provider.  Get up slowly from lying down or sitting positions. This gives your blood pressure a chance to adjust.  Avoid hot showers and excessive heat as directed by your health care provider.  Return to your normal activities as told by your health care provider. Ask your health care provider what activities are safe for you.  Do not use any products that contain nicotine or tobacco, such as cigarettes, e-cigarettes, and chewing tobacco. If you need help quitting, ask your health care provider.  Keep all follow-up visits as told by your health care provider. This is important.   Contact a health care provider if you:  Vomit.  Have diarrhea.  Have a fever for more than 2-3 days.  Feel more thirsty than usual.  Feel weak and  tired. Get help right away if you:  Have chest pain.  Have a fast or irregular heartbeat.  Develop numbness in any part of your body.  Cannot move your arms or your legs.  Have trouble speaking.  Become sweaty or feel light-headed.  Faint.  Feel short of breath.  Have trouble staying awake.  Feel confused. Summary  Hypotension is when the force of blood pumping through your arteries is too weak.  Hypotension may be harmless (benign) or may cause serious problems (be critical).  Treatment for this condition may include changing your diet, changing your medicines, and wearing compression stockings.  In some cases, you may need to go to the hospital for fluid or blood replacement. This information is not intended to replace advice given to you by your health care provider. Make sure you discuss any questions you have with your health care provider. Document Revised: 03/20/2018 Document Reviewed: 03/20/2018 Elsevier Patient Education  2021 [employer].    Skin Abscess  A skin abscess is an infected area on or under your skin that contains a collection of pus and other material. An abscess may also be called a furuncle, carbuncle, or boil. An abscess can occur in or on almost any part of your body. Some abscesses break open (rupture) on their  own. Most continue to get worse unless they are treated. The infection can spread deeper into the body and eventually into your blood, which can make you feel ill. Treatment usually involves draining the abscess. What are the causes? An abscess occurs when germs, like bacteria, pass through your skin and cause an infection. This may be caused by:  A scrape or cut on your skin.  A puncture wound through your skin, including a needle injection or insect bite.  Blocked oil or sweat glands.  Blocked and infected hair follicles.  A cyst that forms beneath your skin (sebaceous cyst) and becomes infected. What increases the risk? This  condition is more likely to develop in people who:  Have a weak body defense system (immune system).  Have diabetes.  Have dry and irritated skin.  Get frequent injections or use illegal IV drugs.  Have a foreign body in a wound, such as a splinter.  Have problems with their lymph system or veins. What are the signs or symptoms? Symptoms of this condition include:  A painful, firm bump under the skin.  A bump with pus at the top. This may break through the skin and drain. Other symptoms include:  Redness surrounding the abscess site.  Warmth.  Swelling of the lymph nodes (glands) near the abscess.  Tenderness.  A sore on the skin. How is this diagnosed? This condition may be diagnosed based on:  A physical exam.  Your medical history.  A sample of pus. This may be used to find out what is causing the infection.  Blood tests.  Imaging tests, such as an ultrasound, CT scan, or MRI. How is this treated? A small abscess that drains on its own may not need treatment. Treatment for larger abscesses may include:  Moist heat or heat pack applied to the area several times a day.  A procedure to drain the abscess (incision and drainage).  Antibiotic medicines. For a severe abscess, you may first get antibiotics through an IV and then change to antibiotics by mouth. Follow these instructions at home: Medicines  Take over-the-counter and prescription medicines only as told by your health care provider.  If you were prescribed an antibiotic medicine, take it as told by your health care provider. Do not stop taking the antibiotic even if you start to feel better.   Abscess care  If you have an abscess that has not drained, apply heat to the affected area. Use the heat source that your health care provider recommends, such as a moist heat pack or a heating pad. ? Place a towel between your skin and the heat source. ? Leave the heat on for 20-30 minutes. ? Remove the heat  if your skin turns bright red. This is especially important if you are unable to feel pain, heat, or cold. You may have a greater risk of getting burned.  Follow instructions from your health care provider about how to take care of your abscess. Make sure you: ? Cover the abscess with a bandage (dressing). ? Change your dressing or gauze as told by your health care provider. ? Wash your hands with soap and water before you change the dressing or gauze. If soap and water are not available, use hand sanitizer.  Check your abscess every day for signs of a worsening infection. Check for: ? More redness, swelling, or pain. ? More fluid or blood. ? Warmth. ? More pus or a bad smell.   General instructions  To avoid  spreading the infection: ? Do not share personal care items, towels, or hot tubs with others. ? Avoid making skin contact with other people.  Keep all follow-up visits as told by your health care provider. This is important. Contact a health care provider if you have:  More redness, swelling, or pain around your abscess.  More fluid or blood coming from your abscess.  Warm skin around your abscess.  More pus or a bad smell coming from your abscess.  A fever.  Muscle aches.  Chills or a general ill feeling. Get help right away if you:  Have severe pain.  See red streaks on your skin spreading away from the abscess. Summary  A skin abscess is an infected area on or under your skin that contains a collection of pus and other material.  A small abscess that drains on its own may not need treatment.  Treatment for larger abscesses may include having a procedure to drain the abscess and taking an antibiotic. This information is not intended to replace advice given to you by your health care provider. Make sure you discuss any questions you have with your health care provider. Document Revised: 01/15/2019 Document Reviewed: 11/07/2017 Elsevier Patient Education  2021  [employer].

## 2021-02-08 NOTE — Progress Notes (Signed)
Established Patient Office Visit  Subjective:  Patient ID: Jill Adkins, female    DOB: [birth year]  Age: 70 y.o. MRN: [id number]  CC:  Chief Complaint  Patient presents with  . Dizziness    HPI Jill Adkins states that she has been having episodes of dizziness  for the the past couple of months.  States that they occur intermittently throughout the day, states that they mainly occur while she is standing.  Denies syncope.  Does not check her blood pressure when these episodes occur.  Reports that she has been compliant to amlodipine 5 mg once a day.  Reports that she was started on his blood pressure medication approximately 20 years ago.  Does not check blood pressure at home.  Reports that she is drinking approximately 30 ounces of water a day.  Last office visit for health maintenance was in June 2020, states that it was difficult to have an appointment due to [place].  Reports that she has been having 2 tender bumps in her right axilla, states that they feel hard, has had a small amount of drainage.  Reports that she has not been using anything on them.  Daughter is present and helps with history and interpreting  Past Medical History:  Diagnosis Date  . Allergy   . Anemia   . Arthritis   . Breast cancer ([place])    right breast  . Cataracts, bilateral   . GERD (gastroesophageal reflux disease)   . Glaucoma   . Headache   . History of anemia   . Hyperlipidemia   . Hypertension   . Seasonal allergies     Past Surgical History:  Procedure Laterality Date  . BREAST LUMPECTOMY WITH RADIOACTIVE SEED LOCALIZATION Right 04/11/2017   Procedure: RIGHT BREAST LUMPECTOMY WITH RADIOACTIVE SEED LOCALIZATION X2;  Surgeon: [name], MD;  Location: [facility];  Service: General;  Laterality: Right;    Family History  Problem Relation Age of Onset  . Hypertension Father   . Colon cancer Brother 16  . Esophageal cancer Neg Hx   . Rectal cancer Neg Hx   . Stomach cancer Neg Hx   .  Liver cancer Neg Hx   . Pancreatic cancer Neg Hx     Social History   Socioeconomic History  . Marital status: Widowed    Spouse name: Not on file  . Number of children: Not on file  . Years of education: Not on file  . Highest education level: Not on file  Occupational History  . Not on file  Tobacco Use  . Smoking status: Never Smoker  . Smokeless tobacco: Never Used  Vaping Use  . Vaping Use: Never used  Substance and Sexual Activity  . Alcohol use: No  . Drug use: No  . Sexual activity: Not Currently    Partners: Male  Other Topics Concern  . Not on file  Social History Narrative  . Not on file   Social Determinants of Health   Financial Resource Strain: Not on file  Food Insecurity: Not on file  Transportation Needs: Not on file  Physical Activity: Not on file  Stress: Not on file  Social Connections: Not on file  Intimate Partner Violence: Not on file    Outpatient Medications Prior to Visit  Medication Sig Dispense Refill  . acetaminophen (TYLENOL) 500 MG tablet Take 2 tablets (1,000 mg total) by mouth every 6 (six) hours as needed for headache. 60 tablet 1  . amLODipine (NORVASC) 5 MG  tablet Take 1 tablet (5 mg total) by mouth daily. 30 tablet 0  . cetirizine (ZYRTEC) 10 MG tablet Take 1 tablet (10 mg total) by mouth daily. 90 tablet 1  . famotidine (PEPCID) 40 MG tablet Take 1 tablet (40 mg total) by mouth 2 (two) times daily. 60 tablet 11  . fluticasone (FLONASE) 50 MCG/ACT nasal spray Place 2 sprays into both nostrils daily. 16 g 6  . letrozole (FEMARA) 2.5 MG tablet TAKE 1 TABLET (2.5 MG TOTAL) BY MOUTH DAILY. 90 tablet 12  . omeprazole (PRILOSEC) 20 MG capsule Take 1 capsule (20 mg total) by mouth daily. 30 capsule 3  . Propylene Glycol (SYSTANE COMPLETE OP) Apply 1 each to eye daily.    . rosuvastatin (CRESTOR) 40 MG tablet TAKE 1 TABLET BY MOUTH EVERY EVENING. 90 tablet 0  . Vitamin D, Cholecalciferol, 1000 units CAPS Take 1,000 Units by mouth 1 day  or 1 dose.    Marland Kitchen Olopatadine HCl 0.2 % SOLN Apply 1 drop to eye daily. (Patient not taking: Reported on 02/08/2021) 2.5 mL 0   Facility-Administered Medications Prior to Visit  Medication Dose Route Frequency Provider Last Rate Last Admin  . 0.9 %  sodium chloride infusion  500 mL Intravenous Continuous Pyrtle, Lajuan Lines, MD      . 0.9 %  sodium chloride infusion  500 mL Intravenous Once Pyrtle, Lajuan Lines, MD        Allergies  Allergen Reactions  . No Known Allergies   . Shrimp [Shellfish Allergy]     Headache, unable to talk    ROS Review of Systems  Constitutional: Negative.   HENT: Negative.   Eyes: Negative.   Respiratory: Negative for shortness of breath.   Cardiovascular: Negative for chest pain.  Gastrointestinal: Negative.   Endocrine: Negative.   Genitourinary: Negative.   Musculoskeletal: Negative.   Skin: Positive for rash.  Allergic/Immunologic: Negative.   Neurological: Positive for dizziness. Negative for syncope, speech difficulty, weakness and headaches.  Hematological: Negative.   Psychiatric/Behavioral: Negative.       Objective:    Physical Exam Vitals and nursing note reviewed.  Constitutional:      Appearance: Normal appearance.  HENT:     Head: Normocephalic and atraumatic.     Right Ear: External ear normal.     Left Ear: External ear normal.     Nose: Nose normal.     Mouth/Throat:     Mouth: Mucous membranes are moist.     Pharynx: Oropharynx is clear.  Eyes:     Extraocular Movements: Extraocular movements intact.     Conjunctiva/sclera: Conjunctivae normal.     Pupils: Pupils are equal, round, and reactive to light.  Cardiovascular:     Rate and Rhythm: Normal rate and regular rhythm.     Pulses: Normal pulses.     Heart sounds: Normal heart sounds.  Pulmonary:     Effort: Pulmonary effort is normal.     Breath sounds: Normal breath sounds.  Musculoskeletal:        General: Normal range of motion.     Cervical back: Normal range of motion  and neck supple.  Skin:    General: Skin is warm and dry.       Neurological:     General: No focal deficit present.     Mental Status: She is alert and oriented to person, place, and time.  Psychiatric:        Mood and Affect: Mood normal.  Behavior: Behavior normal.        Thought Content: Thought content normal.        Judgment: Judgment normal.     BP 109/65 (BP Location: Left Arm, Patient Position: Sitting, Cuff Size: Large)   Pulse 81   Temp 98.2 F (36.8 C) (Oral)   Resp 18   Ht 4' 11.84" (1.52 m)   Wt 173 lb (78.5 kg)   SpO2 97%   BMI 33.96 kg/m  Wt Readings from Last 3 Encounters:  02/08/21 173 lb (78.5 kg)  03/10/18 174 lb (78.9 kg)  02/20/18 174 lb (78.9 kg)     Health Maintenance Due  Topic Date Due  . COVID-19 Vaccine (1) Never done  . DEXA SCAN  Never done  . PNA vac Low Risk Adult (1 of 2 - PCV13) Never done  . MAMMOGRAM  10/05/2018    There are no preventive care reminders to display for this patient.  Lab Results  Component Value Date   TSH 0.81 10/04/2016   Lab Results  Component Value Date   WBC 5.0 12/03/2017   HGB 13.2 12/03/2017   HCT 40.7 12/03/2017   MCV 85.1 12/03/2017   PLT 272 12/03/2017   Lab Results  Component Value Date   NA 143 12/03/2017   K 4.0 12/03/2017   CO2 28 12/03/2017   GLUCOSE 76 12/03/2017   BUN 12 12/03/2017   CREATININE 0.76 12/03/2017   BILITOT 0.4 12/03/2017   ALKPHOS 99 12/03/2017   AST 24 12/03/2017   ALT 29 12/03/2017   PROT 7.4 12/03/2017   ALBUMIN 3.8 12/03/2017   CALCIUM 9.7 12/03/2017   ANIONGAP 9 12/03/2017   Lab Results  Component Value Date   CHOL 180 01/20/2018   Lab Results  Component Value Date   HDL 65 01/20/2018   Lab Results  Component Value Date   LDLCALC 98 01/20/2018   Lab Results  Component Value Date   TRIG 85 01/20/2018   Lab Results  Component Value Date   CHOLHDL 2.8 01/20/2018   Lab Results  Component Value Date   HGBA1C 5.7 09/20/2016       Assessment & Plan:   Problem List Items Addressed This Visit      Cardiovascular and Mediastinum   Essential hypertension     Musculoskeletal and Integument   Furuncle of right axilla     Other   Dizziness - Primary   Relevant Orders   CBC with Differential/Platelet   Comp. Metabolic Panel (12)   TSH    Other Visit Diagnoses    Screening for lipid disorders       Relevant Orders   Lipid panel    1. Dizziness Encouraged patient to monitor blood pressure on a daily basis and to also check blood pressure during episodes of dizziness.  Encouraged patient to increase hydration.  Patient to return to mobile unit tomorrow for fasting labs.  Patient to return to mobile unit in 1 week for further evaluation.  Red flags given for prompt reevaluation. - CBC with Differential/Platelet; Future - Comp. Metabolic Panel (12); Future - TSH; Future  2. Furuncle of right axilla  Trial Bactroban.  Patient education given on warm compresses, keeping area clean and dry.  3. Screening for lipid disorders  - Lipid panel; Future   I have reviewed the patient's medical history (PMH, PSH, Social History, Family History, Medications, and allergies) , and have been updated if relevant. I spent 34 minutes reviewing chart and  face to face time with patient.     Meds ordered this encounter  Medications  . DISCONTD: mupirocin cream (BACTROBAN) 2 %    Sig: Apply 1 application topically 2 (two) times daily.    Dispense:  15 g    Refill:  0    Order Specific Question:   Supervising Provider    Answer:   Elsie Stain [[id number]]    Follow-up: Return in about 1 week (around 02/15/2021).    Loraine Grip Mayers, PA-C

## 2021-02-08 NOTE — Progress Notes (Signed)
Patient denies pain at this time. Patient reports dizziness intermittent for the past few months.Patient reports dizziness solely in the morning after waking, Sensation subsides after 10 mins. Patient reports cyst in the right arm pit that burns appearing 1 month ago and denies ever seeing this concern before.

## 2021-02-09 ENCOUNTER — Other Ambulatory Visit: Payer: Self-pay

## 2021-02-09 DIAGNOSIS — R42 Dizziness and giddiness: Secondary | ICD-10-CM | POA: Insufficient documentation

## 2021-02-09 DIAGNOSIS — L02421 Furuncle of right axilla: Secondary | ICD-10-CM | POA: Insufficient documentation

## 2021-02-09 DIAGNOSIS — Z13220 Encounter for screening for lipoid disorders: Secondary | ICD-10-CM

## 2021-02-09 NOTE — Progress Notes (Signed)
Patient tolerated blood draw well today and will receive results via telephone on Monday.

## 2021-02-10 LAB — CBC WITH DIFFERENTIAL/PLATELET
Basophils Absolute: 0 10*3/uL (ref 0.0–0.2)
Basos: 1 %
EOS (ABSOLUTE): 0.1 10*3/uL (ref 0.0–0.4)
Eos: 3 %
Hematocrit: 40.4 % (ref 34.0–46.6)
Hemoglobin: 13 g/dL (ref 11.1–15.9)
Immature Grans (Abs): 0 10*3/uL (ref 0.0–0.1)
Immature Granulocytes: 0 %
Lymphocytes Absolute: 2.3 10*3/uL (ref 0.7–3.1)
Lymphs: 61 %
MCH: 26.8 pg (ref 26.6–33.0)
MCHC: 32.2 g/dL (ref 31.5–35.7)
MCV: 83 fL (ref 79–97)
Monocytes Absolute: 0.2 10*3/uL (ref 0.1–0.9)
Monocytes: 5 %
Neutrophils Absolute: 1.1 10*3/uL — ABNORMAL LOW (ref 1.4–7.0)
Neutrophils: 30 %
Platelets: 291 10*3/uL (ref 150–450)
RBC: 4.85 x10E6/uL (ref 3.77–5.28)
RDW: 14.4 % (ref 11.7–15.4)
WBC: 3.7 10*3/uL (ref 3.4–10.8)

## 2021-02-10 LAB — COMP. METABOLIC PANEL (12)
AST: 21 IU/L (ref 0–40)
Albumin/Globulin Ratio: 1.6 (ref 1.2–2.2)
Albumin: 4.2 g/dL (ref 3.8–4.8)
Alkaline Phosphatase: 116 IU/L (ref 44–121)
BUN/Creatinine Ratio: 19 (ref 12–28)
BUN: 13 mg/dL (ref 8–27)
Bilirubin Total: 0.3 mg/dL (ref 0.0–1.2)
Calcium: 9.6 mg/dL (ref 8.7–10.3)
Chloride: 107 mmol/L — ABNORMAL HIGH (ref 96–106)
Creatinine, Ser: 0.68 mg/dL (ref 0.57–1.00)
Globulin, Total: 2.6 g/dL (ref 1.5–4.5)
Glucose: 103 mg/dL — ABNORMAL HIGH (ref 65–99)
Potassium: 4 mmol/L (ref 3.5–5.2)
Sodium: 144 mmol/L (ref 134–144)
Total Protein: 6.8 g/dL (ref 6.0–8.5)
eGFR: 95 mL/min/{1.73_m2} (ref >59)

## 2021-02-10 LAB — LIPID PANEL
Chol/HDL Ratio: 4.4 ratio (ref 0.0–4.4)
Cholesterol, Total: 269 mg/dL — ABNORMAL HIGH (ref 100–199)
HDL: 61 mg/dL (ref >39)
LDL Chol Calc (NIH): 193 mg/dL — ABNORMAL HIGH (ref 0–99)
Triglycerides: 91 mg/dL (ref 0–149)
VLDL Cholesterol Cal: 15 mg/dL (ref 5–40)

## 2021-02-10 LAB — TSH: TSH: 1.46 u[IU]/mL (ref 0.450–4.500)

## 2021-02-14 ENCOUNTER — Ambulatory Visit: Payer: Self-pay | Admitting: Physician Assistant

## 2021-02-14 ENCOUNTER — Other Ambulatory Visit: Payer: Self-pay

## 2021-02-14 VITALS — BP 137/68 | HR 72 | Temp 98.2°F | Resp 18 | Ht 59.0 in | Wt 173.0 lb

## 2021-02-14 DIAGNOSIS — I10 Essential (primary) hypertension: Secondary | ICD-10-CM

## 2021-02-14 DIAGNOSIS — E782 Mixed hyperlipidemia: Secondary | ICD-10-CM

## 2021-02-14 DIAGNOSIS — L02421 Furuncle of right axilla: Secondary | ICD-10-CM

## 2021-02-14 DIAGNOSIS — R42 Dizziness and giddiness: Secondary | ICD-10-CM

## 2021-02-14 MED ORDER — ROSUVASTATIN CALCIUM 20 MG PO TABS
20.0000 mg | ORAL_TABLET | Freq: Every evening | ORAL | 0 refills | Status: DC
  Filled 2021-02-14: qty 30, 30d supply, fill #0
  Filled 2021-03-14: qty 30, 30d supply, fill #1

## 2021-02-14 MED ORDER — AMLODIPINE BESYLATE 5 MG PO TABS
2.5000 mg | ORAL_TABLET | Freq: Every day | ORAL | 2 refills | Status: DC
Start: 2021-02-14 — End: 2022-10-17
  Filled 2021-02-14: qty 15, 30d supply, fill #0
  Filled 2021-03-14: qty 15, 30d supply, fill #1
  Filled 2021-05-05: qty 15, 30d supply, fill #2

## 2021-02-14 MED ORDER — SULFAMETHOXAZOLE-TRIMETHOPRIM 400-80 MG PO TABS
1.0000 | ORAL_TABLET | Freq: Two times a day (BID) | ORAL | 0 refills | Status: AC
  Filled 2021-02-14: qty 20, 10d supply, fill #0

## 2021-02-14 MED ORDER — OMEPRAZOLE 20 MG PO CPDR
20.0000 mg | DELAYED_RELEASE_CAPSULE | Freq: Every day | ORAL | 3 refills | Status: DC
  Filled 2021-02-14: qty 30, 30d supply, fill #0
  Filled 2021-03-14: qty 30, 30d supply, fill #1
  Filled 2021-04-12: qty 30, 30d supply, fill #2

## 2021-02-14 NOTE — Progress Notes (Signed)
Patient denies pain at this time. Patient has taken medication for BP at bedtime and patient has eaten today.

## 2021-02-14 NOTE — Progress Notes (Signed)
Established Patient Office Visit  Subjective:  Patient ID: Jill Adkins, female    DOB: [birth year]  Age: 70 y.o. MRN: [id number]  CC:  Chief Complaint  Patient presents with  . Dizziness    HPI Jill Adkins states that she is still taking the amlodipine 2.5 mg once a day at bedtime, states that her BP is  120/80 and 110/60.  States that she has not had any further episodes of dizziness.    States that she has been using the bactroban twice a day as directed without much relief.  Reports that she has several new tender bumps in the same area.  States that she stopped the crestor 3 years ago, states that she thought that it was a one-time prescription.  Due to language barrier, an interpreter was present during the history-taking and subsequent discussion (and for part of the physical exam) with this patient.  Past Medical History:  Diagnosis Date  . Allergy   . Anemia   . Arthritis   . Breast cancer ([place])    right breast  . Cataracts, bilateral   . GERD (gastroesophageal reflux disease)   . Glaucoma   . Headache   . History of anemia   . Hyperlipidemia   . Hypertension   . Seasonal allergies     Past Surgical History:  Procedure Laterality Date  . BREAST LUMPECTOMY WITH RADIOACTIVE SEED LOCALIZATION Right 04/11/2017   Procedure: RIGHT BREAST LUMPECTOMY WITH RADIOACTIVE SEED LOCALIZATION X2;  Surgeon: [name], MD;  Location: [facility];  Service: General;  Laterality: Right;    Family History  Problem Relation Age of Onset  . Hypertension Father   . Colon cancer Brother 45  . Esophageal cancer Neg Hx   . Rectal cancer Neg Hx   . Stomach cancer Neg Hx   . Liver cancer Neg Hx   . Pancreatic cancer Neg Hx     Social History   Socioeconomic History  . Marital status: Widowed    Spouse name: Not on file  . Number of children: Not on file  . Years of education: Not on file  . Highest education level: Not on file  Occupational History  . Not on file  Tobacco  Use  . Smoking status: Never Smoker  . Smokeless tobacco: Never Used  Vaping Use  . Vaping Use: Never used  Substance and Sexual Activity  . Alcohol use: No  . Drug use: No  . Sexual activity: Not Currently    Partners: Male  Other Topics Concern  . Not on file  Social History Narrative  . Not on file   Social Determinants of Health   Financial Resource Strain: Not on file  Food Insecurity: Not on file  Transportation Needs: Not on file  Physical Activity: Not on file  Stress: Not on file  Social Connections: Not on file  Intimate Partner Violence: Not on file    Outpatient Medications Prior to Visit  Medication Sig Dispense Refill  . acetaminophen (TYLENOL) 500 MG tablet Take 2 tablets (1,000 mg total) by mouth every 6 (six) hours as needed for headache. 60 tablet 1  . cetirizine (ZYRTEC) 10 MG tablet Take 1 tablet (10 mg total) by mouth daily. 90 tablet 1  . fluticasone (FLONASE) 50 MCG/ACT nasal spray Place 2 sprays into both nostrils daily. 16 g 6  . letrozole (FEMARA) 2.5 MG tablet TAKE 1 TABLET (2.5 MG TOTAL) BY MOUTH DAILY. 90 tablet 12  . mupirocin ointment (BACTROBAN) 2 %  Apply 1 application topically 2 (two) times daily. 22 g 0  . Propylene Glycol (SYSTANE COMPLETE OP) Apply 1 each to eye daily.    . Vitamin D, Cholecalciferol, 1000 units CAPS Take 1,000 Units by mouth 1 day or 1 dose.    Marland Kitchen amLODipine (NORVASC) 5 MG tablet Take 1 tablet (5 mg total) by mouth daily. 30 tablet 0  . omeprazole (PRILOSEC) 20 MG capsule Take 1 capsule (20 mg total) by mouth daily. 30 capsule 3  . rosuvastatin (CRESTOR) 40 MG tablet TAKE 1 TABLET BY MOUTH EVERY EVENING. 90 tablet 0  . famotidine (PEPCID) 40 MG tablet Take 1 tablet (40 mg total) by mouth 2 (two) times daily. 60 tablet 11  . Olopatadine HCl 0.2 % SOLN Apply 1 drop to eye daily. (Patient not taking: No sig reported) 2.5 mL 0   Facility-Administered Medications Prior to Visit  Medication Dose Route Frequency Provider Last  Rate Last Admin  . 0.9 %  sodium chloride infusion  500 mL Intravenous Continuous Pyrtle, Lajuan Lines, MD      . 0.9 %  sodium chloride infusion  500 mL Intravenous Once Pyrtle, Lajuan Lines, MD        Allergies  Allergen Reactions  . No Known Allergies   . Shrimp [Shellfish Allergy]     Headache, unable to talk    ROS Review of Systems  Constitutional: Negative for chills and fever.  HENT: Negative.   Eyes: Negative.   Respiratory: Negative for shortness of breath.   Cardiovascular: Negative for chest pain.  Gastrointestinal: Negative.   Endocrine: Negative.   Genitourinary: Negative.   Musculoskeletal: Negative.   Skin: Positive for rash.  Allergic/Immunologic: Negative.   Neurological: Negative for dizziness, weakness and headaches.  Hematological: Negative.       Objective:    Physical Exam Vitals and nursing note reviewed.  Constitutional:      Appearance: Normal appearance.  HENT:     Head: Normocephalic and atraumatic.     Right Ear: External ear normal.     Nose: Nose normal.     Mouth/Throat:     Mouth: Mucous membranes are moist.     Pharynx: Oropharynx is clear.  Eyes:     Extraocular Movements: Extraocular movements intact.     Conjunctiva/sclera: Conjunctivae normal.     Pupils: Pupils are equal, round, and reactive to light.  Cardiovascular:     Rate and Rhythm: Normal rate and regular rhythm.     Pulses: Normal pulses.     Heart sounds: Normal heart sounds.  Pulmonary:     Effort: Pulmonary effort is normal.     Breath sounds: Normal breath sounds.  Musculoskeletal:        General: Normal range of motion.     Cervical back: Normal range of motion and neck supple.  Skin:    Findings: Rash present. Rash is pustular.          Comments: In different stages of healing  Neurological:     General: No focal deficit present.     Mental Status: She is alert and oriented to person, place, and time.  Psychiatric:        Mood and Affect: Mood normal.         Behavior: Behavior normal.        Thought Content: Thought content normal.        Judgment: Judgment normal.     BP 137/68 (BP Location: Left Arm, Patient Position: Sitting, Cuff  Size: Large)   Pulse 72   Temp 98.2 F (36.8 C) (Oral)   Resp 18   Ht '4\' 11"'  (1.499 m)   Wt 173 lb (78.5 kg)   SpO2 100%   BMI 34.94 kg/m  Wt Readings from Last 3 Encounters:  02/14/21 173 lb (78.5 kg)  02/08/21 173 lb (78.5 kg)  03/10/18 174 lb (78.9 kg)     Health Maintenance Due  Topic Date Due  . COVID-19 Vaccine (1) Never done  . DEXA SCAN  Never done  . PNA vac Low Risk Adult (1 of 2 - PCV13) Never done  . MAMMOGRAM  10/05/2018    There are no preventive care reminders to display for this patient.  Lab Results  Component Value Date   TSH 1.460 02/09/2021   Lab Results  Component Value Date   WBC 3.7 02/09/2021   HGB 13.0 02/09/2021   HCT 40.4 02/09/2021   MCV 83 02/09/2021   PLT 291 02/09/2021   Lab Results  Component Value Date   NA 144 02/09/2021   K 4.0 02/09/2021   CO2 28 12/03/2017   GLUCOSE 103 (H) 02/09/2021   BUN 13 02/09/2021   CREATININE 0.68 02/09/2021   BILITOT 0.3 02/09/2021   ALKPHOS 116 02/09/2021   AST 21 02/09/2021   ALT 29 12/03/2017   PROT 6.8 02/09/2021   ALBUMIN 4.2 02/09/2021   CALCIUM 9.6 02/09/2021   ANIONGAP 9 12/03/2017   EGFR 95 02/09/2021   Lab Results  Component Value Date   CHOL 269 (H) 02/09/2021   Lab Results  Component Value Date   HDL 61 02/09/2021   Lab Results  Component Value Date   LDLCALC 193 (H) 02/09/2021   Lab Results  Component Value Date   TRIG 91 02/09/2021   Lab Results  Component Value Date   CHOLHDL 4.4 02/09/2021   Lab Results  Component Value Date   HGBA1C 5.7 09/20/2016      Assessment & Plan:   Problem List Items Addressed This Visit      Cardiovascular and Mediastinum   Essential hypertension - Primary   Relevant Medications   rosuvastatin (CRESTOR) 20 MG tablet   amLODipine (NORVASC) 5  MG tablet     Musculoskeletal and Integument   Furuncle of right axilla   Relevant Medications   sulfamethoxazole-trimethoprim (BACTRIM) 400-80 MG tablet     Other   Hyperlipidemia   Relevant Medications   rosuvastatin (CRESTOR) 20 MG tablet   amLODipine (NORVASC) 5 MG tablet   Dizziness     1. Essential hypertension Encourage patient to continue amlodipine 2.5 mg, encourage patient to continue checking blood pressure on a daily basis, keep a written log and have available for all office visits.  Red flags given for prompt reevaluation.    2. Dizziness Currently resolved  3. Furuncle of right axilla Trial Bactrim, continue Bactroban twice a day, patient education given to continue to keep area clean and dry.  Red flags given for prompt reevaluation  4. Mixed hyperlipidemia Reviewed lab results with patient,  Encourage patient to restart Crestor, will restart at lower dose, patient has not taken this medication in 3 years.  Patient education given on low-cholesterol diet. The 10-year ASCVD risk score Mikey Bussing DC Jr., et al., 2013) is: 12.6%   Values used to calculate the score:     Age: 29 years     Sex: Female     Is Non-Hispanic African American: No     Diabetic:  No     Tobacco smoker: No     Systolic Blood Pressure: 742 mmHg     Is BP treated: Yes     HDL Cholesterol: 61 mg/dL     Total Cholesterol: 269 mg/dL   Meds ordered this encounter  Medications  . rosuvastatin (CRESTOR) 20 MG tablet    Sig: Take 1 tablet (20 mg total) by mouth every evening.    Dispense:  90 tablet    Refill:  0    Change in therapy    Order Specific Question:   Supervising Provider    Answer:   Elsie Stain [[id number]]  . amLODipine (NORVASC) 5 MG tablet    Sig: Take 0.5 tablets (2.5 mg total) by mouth daily.    Dispense:  15 tablet    Refill:  2    Dose change    Order Specific Question:   Supervising Provider    Answer:   Asencion Noble E [[id number]]  . sulfamethoxazole-trimethoprim  (BACTRIM) 400-80 MG tablet    Sig: Take 1 tablet by mouth 2 (two) times daily for 10 days.    Dispense:  20 tablet    Refill:  0    Order Specific Question:   Supervising Provider    Answer:   Asencion Noble E [[id number]]  . omeprazole (PRILOSEC) 20 MG capsule    Sig: Take 1 capsule (20 mg total) by mouth daily.    Dispense:  30 capsule    Refill:  3   I have reviewed the patient's medical history (PMH, PSH, Social History, Family History, Medications, and allergies) , and have been updated if relevant. I spent 32 minutes reviewing chart and  face to face time with patient.     Follow-up: Return in about 1 month (around 03/20/2021) for [facility].    Loraine Grip Mayers, PA-C

## 2021-02-14 NOTE — Patient Instructions (Signed)
For your hypertension, continue taking 2.5 mg of amlodipine at bedtime, continue to check your blood pressure on a daily basis, keep a written log and have available for all office visits.  For the boils under your right arm, you will take Bactrim twice a day for the next 10 days.  Continue to use the antibiotic ointment, keeping the area clean and dry.  For your cholesterol, you will restart Crestor, you will restart at a lower dose.  You should take this daily at bedtime.  Please let us know if there is anything else we can do for you  Kennieth Rad, PA-C Physician Assistant [place] [email or web address]

## 2021-02-22 ENCOUNTER — Ambulatory Visit: Payer: Self-pay | Attending: Family Medicine

## 2021-02-22 ENCOUNTER — Ambulatory Visit: Payer: Self-pay

## 2021-02-22 ENCOUNTER — Other Ambulatory Visit: Payer: Self-pay

## 2021-03-13 ENCOUNTER — Encounter: Payer: Self-pay | Admitting: Oncology

## 2021-03-14 ENCOUNTER — Other Ambulatory Visit: Payer: Self-pay

## 2021-03-20 ENCOUNTER — Encounter: Payer: Self-pay | Admitting: Family Medicine

## 2021-03-20 ENCOUNTER — Other Ambulatory Visit: Payer: Self-pay

## 2021-03-20 ENCOUNTER — Ambulatory Visit: Payer: Self-pay | Attending: Family Medicine | Admitting: Family Medicine

## 2021-03-20 VITALS — BP 120/75 | HR 79 | Ht 59.0 in | Wt 188.2 lb

## 2021-03-20 DIAGNOSIS — R42 Dizziness and giddiness: Secondary | ICD-10-CM

## 2021-03-20 DIAGNOSIS — E782 Mixed hyperlipidemia: Secondary | ICD-10-CM

## 2021-03-20 DIAGNOSIS — H1013 Acute atopic conjunctivitis, bilateral: Secondary | ICD-10-CM

## 2021-03-20 MED ORDER — MECLIZINE HCL 25 MG PO TABS
25.0000 mg | ORAL_TABLET | Freq: Two times a day (BID) | ORAL | 1 refills | Status: DC | PRN
Start: 2021-03-20 — End: 2024-06-23
  Filled 2021-03-20: qty 60, 30d supply, fill #0
  Filled 2021-05-05: qty 60, 30d supply, fill #1

## 2021-03-20 MED ORDER — ROSUVASTATIN CALCIUM 20 MG PO TABS
20.0000 mg | ORAL_TABLET | Freq: Every evening | ORAL | 1 refills | Status: DC
  Filled 2021-03-20: qty 90, 90d supply, fill #0
  Filled 2021-04-12: qty 30, 30d supply, fill #0
  Filled 2021-05-08: qty 30, 30d supply, fill #1

## 2021-03-20 MED ORDER — OLOPATADINE HCL 0.2 % OP SOLN
1.0000 [drp] | Freq: Every day | OPHTHALMIC | 1 refills | Status: DC
Start: 2021-03-20 — End: 2021-05-08
  Filled 2021-03-20: qty 2.5, 37d supply, fill #0
  Filled 2021-04-17: qty 2.5, 37d supply, fill #1

## 2021-03-20 NOTE — Progress Notes (Signed)
Subjective:  Patient ID: Jill Adkins, female    DOB: 1950/10/20  Age: 70 y.o. MRN: [id number]  CC: Hypertension   HPI Jill Adkins is a 70 year old female patient with a history of Hypertension, Hyperlipidemia, Right breast DCIS here for acute concerns. She was seen by the PA on the mobile unit last month when her statin was changed due to uncontrolled cholesterol.  Interval History: Complains of dizziness for the last 2 months that is unrelated to position.  Also states she feels the room spinning.  Labs from last month revealed a normal hemoglobin. Complains her eyes have been tearing and itchy.  She currently wears glasses.  Using OTC Systane with no relief.  Past Medical History:  Diagnosis Date   Allergy    Anemia    Arthritis    Breast cancer ([place])    right breast   Cataracts, bilateral    GERD (gastroesophageal reflux disease)    Glaucoma    Headache    History of anemia    Hyperlipidemia    Hypertension    Seasonal allergies     Past Surgical History:  Procedure Laterality Date   BREAST LUMPECTOMY WITH RADIOACTIVE SEED LOCALIZATION Right 04/11/2017   Procedure: RIGHT BREAST LUMPECTOMY WITH RADIOACTIVE SEED LOCALIZATION X2;  Surgeon: [name], MD;  Location: [place];  Service: General;  Laterality: Right;    Family History  Problem Relation Age of Onset   Hypertension Father    Colon cancer Brother 3   Esophageal cancer Neg Hx    Rectal cancer Neg Hx    Stomach cancer Neg Hx    Liver cancer Neg Hx    Pancreatic cancer Neg Hx     Allergies  Allergen Reactions   No Known Allergies    Shrimp [Shellfish Allergy]     Headache, unable to talk    Outpatient Medications Prior to Visit  Medication Sig Dispense Refill   amLODipine (NORVASC) 5 MG tablet Take 0.5 tablets (2.5 mg total) by mouth daily. 15 tablet 2   letrozole (FEMARA) 2.5 MG tablet TAKE 1 TABLET (2.5 MG TOTAL) BY MOUTH DAILY. 90 tablet 12   rosuvastatin (CRESTOR) 20 MG tablet Take 1 tablet  (20 mg total) by mouth every evening. 90 tablet 0   acetaminophen (TYLENOL) 500 MG tablet Take 2 tablets (1,000 mg total) by mouth every 6 (six) hours as needed for headache. (Patient not taking: Reported on 03/20/2021) 60 tablet 1   cetirizine (ZYRTEC) 10 MG tablet Take 1 tablet (10 mg total) by mouth daily. (Patient not taking: Reported on 03/20/2021) 90 tablet 1   famotidine (PEPCID) 40 MG tablet Take 1 tablet (40 mg total) by mouth 2 (two) times daily. 60 tablet 11   fluticasone (FLONASE) 50 MCG/ACT nasal spray Place 2 sprays into both nostrils daily. (Patient not taking: Reported on 03/20/2021) 16 g 6   mupirocin ointment (BACTROBAN) 2 % Apply 1 application topically 2 (two) times daily. (Patient not taking: Reported on 03/20/2021) 22 g 0   omeprazole (PRILOSEC) 20 MG capsule Take 1 capsule (20 mg total) by mouth daily. (Patient not taking: Reported on 03/20/2021) 30 capsule 3   Propylene Glycol (SYSTANE COMPLETE OP) Apply 1 each to eye daily. (Patient not taking: Reported on 03/20/2021)     Vitamin D, Cholecalciferol, 1000 units CAPS Take 1,000 Units by mouth 1 day or 1 dose. (Patient not taking: Reported on 03/20/2021)     Olopatadine HCl 0.2 % SOLN Apply 1 drop to eye daily. (  Patient not taking: No sig reported) 2.5 mL 0   Facility-Administered Medications Prior to Visit  Medication Dose Route Frequency Provider Last Rate Last Admin   0.9 %  sodium chloride infusion  500 mL Intravenous Continuous Pyrtle, Lajuan Lines, MD       0.9 %  sodium chloride infusion  500 mL Intravenous Once Pyrtle, Lajuan Lines, MD         ROS Review of Systems  Constitutional:  Negative for activity change, appetite change and fatigue.  HENT:  Negative for congestion, sinus pressure and sore throat.   Eyes:  Positive for itching. Negative for visual disturbance.  Respiratory:  Negative for cough, chest tightness, shortness of breath and wheezing.   Cardiovascular:  Negative for chest pain and palpitations.  Gastrointestinal:   Negative for abdominal distention, abdominal pain and constipation.  Endocrine: Negative for polydipsia.  Genitourinary:  Negative for dysuria and frequency.  Musculoskeletal:  Negative for arthralgias and back pain.  Skin:  Negative for rash.  Neurological:  Negative for tremors, light-headedness and numbness.  Hematological:  Does not bruise/bleed easily.  Psychiatric/Behavioral:  Negative for agitation and behavioral problems.    Objective:  BP 120/75   Pulse 79   Ht 4\' 11"  (1.499 m)   Wt 188 lb 3.2 oz (85.4 kg)   SpO2 97%   BMI 38.01 kg/m   BP/Weight 03/20/2021 4/31/5400 05/13/7618  Systolic BP 509 326 712  Diastolic BP 75 68 65  Wt. (Lbs) 188.2 173 173  BMI 38.01 34.94 33.96      Physical Exam Constitutional:      Appearance: She is well-developed.  Eyes:     General:        Right eye: No discharge.        Left eye: No discharge.     Conjunctiva/sclera: Conjunctivae normal.  Neck:     Vascular: No JVD.  Cardiovascular:     Rate and Rhythm: Normal rate.     Heart sounds: Normal heart sounds. No murmur heard. Pulmonary:     Effort: Pulmonary effort is normal.     Breath sounds: Normal breath sounds. No wheezing or rales.  Chest:     Chest wall: No tenderness.  Abdominal:     General: Bowel sounds are normal. There is no distension.     Palpations: Abdomen is soft. There is no mass.     Tenderness: There is no abdominal tenderness.  Musculoskeletal:        General: Normal range of motion.     Right lower leg: No edema.     Left lower leg: No edema.  Neurological:     Mental Status: She is alert and oriented to person, place, and time.  Psychiatric:        Mood and Affect: Mood normal.    CMP Latest Ref Rng & Units 02/09/2021 12/03/2017 04/04/2017  Glucose 65 - 99 mg/dL 103(H) 76 96  BUN 8 - 27 mg/dL 13 12 6   Creatinine 0.57 - 1.00 mg/dL 0.68 0.76 0.63  Sodium 134 - 144 mmol/L 144 143 141  Potassium 3.5 - 5.2 mmol/L 4.0 4.0 3.8  Chloride 96 - 106 mmol/L  107(H) 106 108  CO2 22 - 29 mmol/L - 28 27  Calcium 8.7 - 10.3 mg/dL 9.6 9.7 9.6  Total Protein 6.0 - 8.5 g/dL 6.8 7.4 -  Total Bilirubin 0.0 - 1.2 mg/dL 0.3 0.4 -  Alkaline Phos 44 - 121 IU/L 116 99 -  AST 0 -  40 IU/L 21 24 -  ALT 0 - 55 U/L - 29 -    Lipid Panel     Component Value Date/Time   CHOL 269 (H) 02/09/2021 1110   TRIG 91 02/09/2021 1110   HDL 61 02/09/2021 1110   CHOLHDL 4.4 02/09/2021 1110   CHOLHDL 3.7 09/21/2016 0916   VLDL 16 09/21/2016 0916   LDLCALC 193 (H) 02/09/2021 1110    CBC    Component Value Date/Time   WBC 3.7 02/09/2021 1110   WBC 5.0 12/03/2017 0916   WBC 4.6 04/04/2017 0855   RBC 4.85 02/09/2021 1110   RBC 4.78 12/03/2017 0916   HGB 13.0 02/09/2021 1110   HCT 40.4 02/09/2021 1110   PLT 291 02/09/2021 1110   MCV 83 02/09/2021 1110   MCH 26.8 02/09/2021 1110   MCH 27.6 12/03/2017 0916   MCHC 32.2 02/09/2021 1110   MCHC 32.4 12/03/2017 0916   RDW 14.4 02/09/2021 1110   LYMPHSABS 2.3 02/09/2021 1110   MONOABS 0.3 12/03/2017 0916   EOSABS 0.1 02/09/2021 1110   BASOSABS 0.0 02/09/2021 1110    Lab Results  Component Value Date   HGBA1C 5.7 09/20/2016    Assessment & Plan:  1. Mixed hyperlipidemia Uncontrolled Regimen was recently changed to Crestor last month Continue low-cholesterol diet - rosuvastatin (CRESTOR) 20 MG tablet; Take 1 tablet (20 mg total) by mouth every evening.  Dispense: 90 tablet; Refill: 1  2. Vertigo CBC negative for anemia We will place on meclizine - meclizine (ANTIVERT) 25 MG tablet; Take 1 tablet (25 mg total) by mouth 2 (two) times daily as needed for dizziness.  Dispense: 60 tablet; Refill: 1  3. Allergic conjunctivitis of both eyes Likely due to pollen - Olopatadine HCl 0.2 % SOLN; Apply 1 drop to eye daily.  Dispense: 2.5 mL; Refill: 1   Meds ordered this encounter  Medications   rosuvastatin (CRESTOR) 20 MG tablet    Sig: Take 1 tablet (20 mg total) by mouth every evening.    Dispense:  90  tablet    Refill:  1   meclizine (ANTIVERT) 25 MG tablet    Sig: Take 1 tablet (25 mg total) by mouth 2 (two) times daily as needed for dizziness.    Dispense:  60 tablet    Refill:  1   Olopatadine HCl 0.2 % SOLN    Sig: Apply 1 drop to eye daily.    Dispense:  2.5 mL    Refill:  1    Follow-up: Return in about 3 months (around 06/20/2021) for PCP for chronic medical conditions.       Charlott Rakes, MD, FAAFP. [facility] [place], [place]   03/20/2021, 4:42 PM

## 2021-03-20 NOTE — Patient Instructions (Signed)
Dyslipidmie Dyslipidemia La dyslipidmie est un dsquilibre des substances cireuses, semblables  la graisse (lipides), [employer] sang. Le corps a besoin de lipides en petites quantits. La dyslipidmie est souvent lie  un taux lev de cholestrol ou detriglycrides [employer] types de lipides. Les formes communes de dyslipidmie comprennent : Des taux levs de cholestrol LDL. Le cholestrol LDL est le type de cholestrol qui entrane la formation de dpts de graisse (plaques) dans les vaisseaux sanguins qui transportent le sang du coeur vers les organes (artres). Des taux faibles de cholestrol HDL. Le cholestrol HDL est le type de cholestrol qui protge contre les maladies cardiaques. Des taux levs de cholestrol HDL retirent les plaques de cholestrol LDL des artres. Un taux lev de triglycrides. Les Fish farm manager des substances grasses prsentes [employer] sang et responsables de la formation de plaque [employer] artres. Quelles sont les causes ? La dyslipidmie primaire est cause par Starbucks Corporation (mutations) dans les gnes qui sont transmis de gnration en gnration (hrditaires). Ces mutations entranent plusieurs types de dyslipidmie. La dyslipidmie secondaire est cause par [employer] de mode de vie et des maladies entranant cette affection, par exemple : Une alimentation riche en [employer]oco[employer]ill[employer]. Un manque d'activit physique. Le diabte, une maladie rnale, hpatique ou de la thyrode. La consommation de grandes quantits d'alcool. La prise de certains mdicaments. Quels sont les facteurs qui augmentent le risque ? Vous tes plus susceptible de dvelopper cette affection si vous tes un homme g ou si vous tes une femme mnopause. Les facteurs de risque incluent : Des antcdents familiaux de dyslipidmie. La prise de PPL Corporation, y compris la pilule contraceptive, les strodes, certains diurtiques et les bta-bloquants. La consommation de  Google tabac. Une alimentation riche en graisses. Certaines affections mdicales telles que le diabte, le syndrome des Limited Brands (SOPK), une maladie rnale, une maladie hpatique ou une hypothyrodie. Le manque d'activit physique rgulire. Le surpoids ou l'obsit avec un excs de graisse au niveau de l'abdomen. Quels sont les signes ou symptmes ? Dans la plupart des cas, la dyslipidmie ne cause gnralement pas de symptmes. Dans les cas graves, des taux trs levs de lipides peuvent entraner : Des bosses graisseuses sous la peau (xanthomes). Un anneau blanc ou gris autour du centre [place] de l'oil (pupille). Des taux trs levs de triglycrides peuvent provoquer une inflammation du pancras (pancratite). Comment se fait le diagnostic ? Votre prestataire de soins de sant pourra diagnostiquer une dyslipidmie au W.[employer] Inc examen sanguin de routine (prise de sang  [place]). Parce que les symptmes sont souvent inexistants, cet examen sanguin (profil lipidique) est effectu tous les 5 ans, chez les adultes gs de 20 ans et plus. Cet examen vrifie les taux sanguins suivants : Cholestrol total. C'est une mesure de la quantit totale de cholestrol [employer] sang, y compris le cholestrol LDL, le cholestrol HDL et les triglycrides. Un taux infrieur  200 est considr comme sain. Cholestrol LDL. Le taux cible de cholestrol LDL optimal est diffrent pour Aetna en fonction des facteurs de risque individuels. Demandez  votre prestataire de soins de sant quel devrait tre votre taux de cholestrol LDL. Cholestrol HDL. Un taux de cholestrol HDL gal ou suprieur  60 est prfrable car il permet de protger [employer] maladies cardiaques. Un taux infrieur  40 chez les hommes et  50 H[employer] Inc femmes augmente le risque de [place] cardiaque. Triglycrides. Un taux de triglycrides infrieur  150 est considr comme sain. Si votre profil  lipidique est anormal, votre  prestataire de soins de santpourra effectuer d'autres examens sanguins. Comment cette affection est-elle traite ? Le traitement dpend du type de dyslipidmie dont vous souffrez et des Murphy Oil de risque de maladie cardiaque et d'accident vasculaire crbral. En fonction de ces facteurs, votre prestataire de soins de sant tablira uneplage de valeurs cibles de vos taux de lipides. Pour de Hunts[employer] Corporation, Marine scientist de cette affection Copywriter, advertising des Dow Chemical mode de vie, Multimedia programmer en ce qui concerne l'alimentation et l'activit physique. Votre prestataire de soins de sant pourra vous recommander de : Retail banker activit physique de [place] rgulire. Apporter des Advice worker. Arrter de fumer, le cas chant. Si les Radio broadcast assistant rgime alimentaire et une activit physique rgulire ne vous permettent pas d'atteindre vos objectifs, votre prestataire de soins de sant pourra vous prescrire des mdicaments visant  faire baisser vos taux de lipides. Le mdicament le plus frquemment prescrit vise  faire baisser le taux de cholestrol LDL (statine). Si vous avez un taux lev de triglycrides, votre prestataire de soins de sant pourra vous prescrire un autre type de mdicament (fibrate) ou un supplment d'huile de poisson omga-3, ou les deux. [place] les instructions suivantes  domicile :  Alimentation et boissons [employer] Stearns les instructions de votre prestataire de soins de sant ou ditticien en ce qui concerne les restrictions relatives  ce que vous pouvez manger ou boire. Adoptez un rgime alimentaire sain tel que recommand par Sun [employer]systems de soins de sant. Cela pourra vous aider  atteindre et  garder un poids sain,  faire baisser votre taux de cholestrol LDL, et  augmenter votre taux de cholestrol HDL. Vous pourriez : Geographical information systems officer de calories si vous souffrez de surpoids. Manger plus de fruits, de  lgumes, de crales compltes, de poisson et de Pe[employer]. Limiter les graisses satures, les graisses trans et le cholestrol. Si vous consommez de l'alcool : Nurse, mental health. Stann Ore conscience de la quantit d'alcool contenue dans votre verre. Aux .-U., un verre correspond  une bouteille de bire (355 ml [12 onces]),  un verre de vin (148 ml [5 onces]) ou  un verre  liqueur d'alcool fort (44 ml [1,5 once]). Ne buvez pas d'alcool si : Votre prestataire de soins de sant vous l'interdit. Vous tes enceinte, pourriez l'tre ou envisagez de commencer [employer]. Activits Pratiquez une activit physique de [place] rgulire. Commencez un programme d'exercice physique et de musculation en suivant les recommandations de votre prestataire de soins de sant. Demandez  votre prestataire de soins de sant quelles activits sont sans danger pour vous. Votre prestataire de soins de sant pourra recommander : 30 minutes d'exercice d'arobie 4  6 jours par semaine. La marche rapide est un exemple d'exercice d'arobie. Une sance de musculation 2 fois par semaine. Instructions gnrales N'utilisez pas de Sun Microsystems tabac ou de la nicotine, tels que les cigarettes, les cigarettes lectroniques et Musician. Si vous avez besoin d'aide pour arrter de fumer, demandez conseil  votre prestataire de soins de sant. Prenez vos mdicaments en vente libre et sur ordonnance en suivant scrupuleusement les instructions de votre prestataire de soins de sant. Cela inclut les supplments. Rendez-vous  toutes vos visites de suivi, comme prvu par Sun Microsystems de soins de sant. Prenez contact avec un prestataire de soins de sant si : Vous avez : Des difficults  suivre votre rgime alimentaire ou votre programme d'exercice physique. Des difficults pour arrter  de fumer ou Associate Professor. Rsum La dyslipidmie est souvent lie  un taux lev de  cholestrol ou de triglycrides [employer] types de lipides. Le traitement dpend du type de dyslipidmie dont vous souffrez et des autres facteurs de risque de Games developer cardiaque et d'accident vasculaire crbral. Pour de nombreuses personnes, le traitement commence par des Dow Chemical mode de vie, notamment l'alimentation et l'activit physique. Votre prestataire de soins de sant pourra vous prescrire des mdicaments visant  faire baisser votre taux de lipides. Ces conseils et renseignements ne sauraient se substituer  l'avis mdical de votre prestataire de soins de sant. Par consquent, il est primordial deparler de toutes vos proccupations avec votre prestataire de soins de sant. Document Revised: 05/23/2018 Document Reviewed: 05/23/2018 Elsevier Patient Education  [place].

## 2021-03-21 ENCOUNTER — Other Ambulatory Visit: Payer: Self-pay

## 2021-03-27 ENCOUNTER — Other Ambulatory Visit: Payer: Self-pay

## 2021-03-27 DIAGNOSIS — Z170 Estrogen receptor positive status [ER+]: Secondary | ICD-10-CM

## 2021-03-28 ENCOUNTER — Other Ambulatory Visit: Payer: Self-pay

## 2021-03-28 ENCOUNTER — Telehealth: Payer: Self-pay | Admitting: Oncology

## 2021-03-28 ENCOUNTER — Inpatient Hospital Stay (HOSPITAL_BASED_OUTPATIENT_CLINIC_OR_DEPARTMENT_OTHER): Payer: Self-pay | Admitting: Oncology

## 2021-03-28 ENCOUNTER — Inpatient Hospital Stay: Payer: Self-pay | Attending: Oncology

## 2021-03-28 VITALS — BP 139/72 | HR 70 | Temp 97.4°F | Resp 18 | Ht 59.0 in | Wt 183.9 lb

## 2021-03-28 DIAGNOSIS — Z170 Estrogen receptor positive status [ER+]: Secondary | ICD-10-CM

## 2021-03-28 DIAGNOSIS — I10 Essential (primary) hypertension: Secondary | ICD-10-CM | POA: Insufficient documentation

## 2021-03-28 DIAGNOSIS — C50311 Malignant neoplasm of lower-inner quadrant of right female breast: Secondary | ICD-10-CM

## 2021-03-28 DIAGNOSIS — E785 Hyperlipidemia, unspecified: Secondary | ICD-10-CM | POA: Insufficient documentation

## 2021-03-28 DIAGNOSIS — Z79811 Long term (current) use of aromatase inhibitors: Secondary | ICD-10-CM | POA: Insufficient documentation

## 2021-03-28 DIAGNOSIS — Z79899 Other long term (current) drug therapy: Secondary | ICD-10-CM | POA: Insufficient documentation

## 2021-03-28 DIAGNOSIS — C50811 Malignant neoplasm of overlapping sites of right female breast: Secondary | ICD-10-CM | POA: Insufficient documentation

## 2021-03-28 LAB — CBC WITH DIFFERENTIAL (CANCER CENTER ONLY)
Abs Immature Granulocytes: 0.01 10*3/uL (ref 0.00–0.07)
Basophils Absolute: 0 10*3/uL (ref 0.0–0.1)
Basophils Relative: 1 %
Eosinophils Absolute: 0.1 10*3/uL (ref 0.0–0.5)
Eosinophils Relative: 3 %
HCT: 39.1 % (ref 36.0–46.0)
Hemoglobin: 13 g/dL (ref 12.0–15.0)
Immature Granulocytes: 0 %
Lymphocytes Relative: 60 %
Lymphs Abs: 2.8 10*3/uL (ref 0.7–4.0)
MCH: 27 pg (ref 26.0–34.0)
MCHC: 33.2 g/dL (ref 30.0–36.0)
MCV: 81.3 fL (ref 80.0–100.0)
Monocytes Absolute: 0.3 10*3/uL (ref 0.1–1.0)
Monocytes Relative: 6 %
Neutro Abs: 1.4 10*3/uL — ABNORMAL LOW (ref 1.7–7.7)
Neutrophils Relative %: 30 %
Platelet Count: 279 10*3/uL (ref 150–400)
RBC: 4.81 MIL/uL (ref 3.87–5.11)
RDW: 14.1 % (ref 11.5–15.5)
WBC Count: 4.7 10*3/uL (ref 4.0–10.5)
nRBC: 0 % (ref 0.0–0.2)

## 2021-03-28 LAB — CMP (CANCER CENTER ONLY)
ALT: 13 U/L (ref 0–44)
AST: 18 U/L (ref 15–41)
Albumin: 3.8 g/dL (ref 3.5–5.0)
Alkaline Phosphatase: 107 U/L (ref 38–126)
Anion gap: 9 (ref 5–15)
BUN: 10 mg/dL (ref 8–23)
CO2: 25 mmol/L (ref 22–32)
Calcium: 9.5 mg/dL (ref 8.9–10.3)
Chloride: 107 mmol/L (ref 98–111)
Creatinine: 0.73 mg/dL (ref 0.44–1.00)
GFR, Estimated: >60 mL/min (ref >60)
Glucose, Bld: 90 mg/dL (ref 70–99)
Potassium: 3.9 mmol/L (ref 3.5–5.1)
Sodium: 141 mmol/L (ref 135–145)
Total Bilirubin: 0.4 mg/dL (ref 0.3–1.2)
Total Protein: 7.4 g/dL (ref 6.5–8.1)

## 2021-03-28 MED ORDER — LETROZOLE 2.5 MG PO TABS
ORAL_TABLET | Freq: Every day | ORAL | 2 refills | Status: AC
  Filled 2021-03-28: qty 30, 30d supply, fill #0
  Filled 2021-03-30: qty 90, 90d supply, fill #0

## 2021-03-28 NOTE — Telephone Encounter (Signed)
Scheduled appointment per 06/21 los. Patient is aware. 

## 2021-03-28 NOTE — Progress Notes (Signed)
[place]  [phone] [phone]     ID: Jill Adkins DOB: [id number]  MR#: [id number]  GXQ#:[id number]  Patient Care Team: Gildardo Pounds, NP as PCP - General (Nurse Practitioner) Tumeka Chimenti, Virgie Dad, MD as Consulting Physician (Oncology) [name], MD as Consulting Physician (General Surgery) Lenn Cal, DDS as Consulting Physician (Dentistry) OTHER MD:  CHIEF COMPLAINT: Ductal carcinoma in situ  CURRENT TREATMENT: Completing 5 years of letrozole letrozole   INTERVAL HISTORY: Zamaria returns today for follow-up of her estrogen receptor positive breast cancer. She was last seen in 68/2019. She then traveled to [place] and was unable to return due to Covid travel restrictions.  Today she is accompanied by her daughter and an interpreter  She continues on Letrozole, with good tolerance. She has hot flashes ("les chaleurs") but they are mild.  Vaginal dryness is not an issue.  Since her last visit, her daughter informed us of a bump in Varnell's right axilla. She underwent bilateral diagnostic mammography with tomography and right axilla ultrasonography at [facility] on 02/02/2021 showing: breast density category B; no evidence of malignancy in either breast; benign epidermal cyst at site of palpable concern in right axilla.   REVIEW OF SYSTEMS: Shatasha walks about 30 minutes most days.  She has a funny feeling in her head like an itch.  She it is not on the scalp she says.  She has not lost her hair.  Her eyes do water and she is using eyedrops for that.  Otherwise a detailed review of systems today was stable   COVID 19 VACCINATION STATUS: Status post 2 vaccine doses, no booster as of June 2022   BREAST CANCER HISTORY: From the original intake note:  The patient had routine screening mammography 10/05/2016 showing abnormal grouped calcifications in the right breast upper outer quadrant. On 10/11/2016 she underwent right diagnostic mammography  with tomography. His found the breast density to be category B. In the right breast at the 12:00 position there was an oval asymmetry. There were also grouped calcifications in the right breast at the 7:00 middle depth. Ultrasound was performed the same day. This confirmed a 0.7 cm hypoechoic mass in the right breast upper outer quadrant. The right axilla was benign.  On 10/17/2016 the patient underwent ultrasound-guided biopsy of the 7 mm mass in the right breast and this showed ([id number]) a fibroadenoma, with no evidence of malignancy. 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  Given the multicentric disease, the patient had breast MRI 11/05/2016. There were in addition to the fibroadenoma, 2 hematomas in the inferior right breast one in the inner and one in the outer quadrant. There was significant surrounding enhancement in the inferior half of the right breast some of which was postprocedural. The biopsy clips were 6.5 cm apart. 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   Her subsequent history is as detailed below   PAST MEDICAL HISTORY: Past  Medical History:  Diagnosis Date   Allergy    Anemia    Arthritis    Breast cancer ([place])    right breast   Cataracts, bilateral    GERD (gastroesophageal reflux disease)    Glaucoma    Headache    History of anemia    Hyperlipidemia    Hypertension     Seasonal allergies     PAST SURGICAL HISTORY: Past Surgical History:  Procedure Laterality Date   BREAST LUMPECTOMY WITH RADIOACTIVE SEED LOCALIZATION Right 04/11/2017   Procedure: RIGHT BREAST LUMPECTOMY WITH RADIOACTIVE SEED LOCALIZATION X2;  Surgeon: [name], MD;  Location: [facility];  Service: General;  Laterality: Right;    FAMILY HISTORY Family History  Problem Relation Age of Onset   Hypertension Father    Colon cancer Brother 7   Esophageal cancer Neg Hx    Rectal cancer Neg Hx    Stomach cancer Neg Hx    Liver cancer Neg Hx    Pancreatic cancer Neg Hx   The patient's father died at the age of 39, with a history of hypertension; the patient's mother is alive at age 52. The patient had 3 brothers 2 sisters. There is no history of cancer in the family to her knowledge.   GYNECOLOGIC HISTORY:  Menarche age 70, first live birth age 36. The patient is GX P3. She does not remember when she stopped having periods. She did not use oral contraceptives. She used birth control pills remotely with no complications. No LMP recorded. Patient is postmenopausal.   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 She describes herself is single. She is currently residing with her daughter [name] in [place]. [name] works in Oceanographer. [name] lives in [place] and is a Scientist, clinical (histocompatibility and immunogenetics) and Electrical engineer lives in [place] where she is studying. The patient has 11 grandchildren. The patient attends the local mosque   ADVANCED DIRECTIVES: Not in place   HEALTH MAINTENANCE: Social History   Tobacco Use   Smoking status: Never   Smokeless tobacco: Never  Vaping Use   Vaping Use: Never used  Substance Use Topics   Alcohol use: No   Drug use: No     Colonoscopy:Never  PAP:  Bone density: Never   Allergies  Allergen Reactions   No Known Allergies    Shrimp [Shellfish Allergy]     Headache, unable to talk    Current Outpatient Medications   Medication Sig Dispense Refill   amLODipine (NORVASC) 5 MG tablet Take 0.5 tablets (2.5 mg total) by mouth daily. 15 tablet 2   letrozole (FEMARA) 2.5 MG tablet TAKE 1 TABLET (2.5 MG TOTAL) BY MOUTH DAILY. 90 tablet 12   meclizine (ANTIVERT) 25 MG tablet Take 1 tablet (25 mg total) by mouth 2 (two) times daily as needed for dizziness. 60 tablet 1   mupirocin ointment (BACTROBAN) 2 % Apply 1 application topically 2 (two) times daily. (Patient not taking: Reported on 03/20/2021) 22 g 0   Olopatadine HCl 0.2 % SOLN Apply 1 drop to eye daily. 2.5 mL 1   omeprazole (PRILOSEC) 20 MG capsule Take 1 capsule (20 mg total) by mouth daily. (Patient not taking: Reported on 03/20/2021) 30 capsule 3   rosuvastatin (CRESTOR) 20 MG tablet Take 1 tablet (20 mg total) by mouth every evening. 90 tablet 1   Vitamin D, Cholecalciferol, 1000 units CAPS Take 1,000 Units by mouth 1 day or 1 dose. (Patient not taking: Reported  on 03/20/2021)     Current Facility-Administered Medications  Medication Dose Route Frequency Provider Last Rate Last Admin   0.9 %  sodium chloride infusion  500 mL Intravenous Continuous Pyrtle, Lajuan Lines, MD       0.9 %  sodium chloride infusion  500 mL Intravenous Once Pyrtle, Lajuan Lines, MD        OBJECTIVE: Middle-aged African woman who appears stated age Vitals:   03/28/21 1441  BP: 139/72  Pulse: 70  Resp: 18  Temp: (!) 97.4 F (36.3 C)  SpO2: 100%      Body mass index is 37.14 kg/m.    ECOG FS:1 - Symptomatic but completely ambulatory  Sclerae unicteric, EOMs intact Wearing a mask No cervical or supraclavicular adenopathy Lungs no rales or rhonchi Heart regular rate and rhythm Abd soft, nontender, positive bowel sounds MSK no focal spinal tenderness, no upper extremity lymphedema Neuro: nonfocal, well oriented, appropriate affect Breasts: The right breast has undergone lumpectomy with an excellent cosmetic result.  There is no evidence of residual or recurrent disease.  The left  breast and both axillae are benign.   LAB RESULTS:  CMP     Component Value Date/Time   NA 144 02/09/2021 1110   K 4.0 02/09/2021 1110   CL 107 (H) 02/09/2021 1110   CO2 28 12/03/2017 0916   GLUCOSE 103 (H) 02/09/2021 1110   GLUCOSE 76 12/03/2017 0916   BUN 13 02/09/2021 1110   CREATININE 0.68 02/09/2021 1110   CREATININE 0.76 12/03/2017 0916   CREATININE 0.74 10/04/2016 1001   CALCIUM 9.6 02/09/2021 1110   PROT 6.8 02/09/2021 1110   ALBUMIN 4.2 02/09/2021 1110   AST 21 02/09/2021 1110   AST 24 12/03/2017 0916   ALT 29 12/03/2017 0916   ALKPHOS 116 02/09/2021 1110   BILITOT 0.3 02/09/2021 1110   BILITOT 0.4 12/03/2017 0916   GFRNONAA >60 12/03/2017 0916   GFRAA >60 12/03/2017 0916    INo results found for: SPEP, UPEP  Lab Results  Component Value Date   WBC 4.7 03/28/2021   NEUTROABS 1.4 (L) 03/28/2021   HGB 13.0 03/28/2021   HCT 39.1 03/28/2021   MCV 81.3 03/28/2021   PLT 279 03/28/2021      Chemistry      Component Value Date/Time   NA 144 02/09/2021 1110   K 4.0 02/09/2021 1110   CL 107 (H) 02/09/2021 1110   CO2 28 12/03/2017 0916   BUN 13 02/09/2021 1110   CREATININE 0.68 02/09/2021 1110   CREATININE 0.76 12/03/2017 0916   CREATININE 0.74 10/04/2016 1001      Component Value Date/Time   CALCIUM 9.6 02/09/2021 1110   ALKPHOS 116 02/09/2021 1110   AST 21 02/09/2021 1110   AST 24 12/03/2017 0916   ALT 29 12/03/2017 0916   BILITOT 0.3 02/09/2021 1110   BILITOT 0.4 12/03/2017 0916       No results found for: LABCA2  No components found for: LABCA125  No results for input(s): INR in the last 168 hours.  Urinalysis    Component Value Date/Time   COLORURINE YELLOW 06/06/2009 1613   APPEARANCEUR CLEAR 06/06/2009 1613   LABSPEC 1.020 06/06/2009 1613   PHURINE 6.5 06/06/2009 1613   GLUCOSEU NEGATIVE 06/06/2009 1613   HGBUR NEGATIVE 06/06/2009 1613   BILIRUBINUR NEGATIVE 06/06/2009 1613   [place] 06/06/2009 1613   PROTEINUR  NEGATIVE 06/06/2009 1613   UROBILINOGEN 0.2 06/06/2009 1613   NITRITE NEGATIVE 06/06/2009 1613   LEUKOCYTESUR  06/06/2009 1613    NEGATIVE MICROSCOPIC NOT DONE ON URINES WITH NEGATIVE PROTEIN, BLOOD, LEUKOCYTES, NITRITE, OR GLUCOSE <1000 mg/dL.    STUDIES: No results found.   ELIGIBLE FOR AVAILABLE RESEARCH PROTOCOL: No  ASSESSMENT: 70 y.o. [place] speaker originally from [place] status post right breast upper outer and lower outer quadrant biopsies 10/17/2016 and 10/29/2016, both showing ductal carcinoma in situ, grade 2, strongly estrogen and progesterone receptor positive.  (a) the patient opted to postpone definitive surgery for travel reasons  (1) started letrozole 11/07/2016, completing 5 years December 2022  (2) MRI guided biopsy of the left breast suspicious area was benign (02/18/2017, [id number])  (3) Status post right lumpectomy 04/11/2017 ([id number]) showing: atypical ductal hyperplasia. No residual carcinoma identified.   (4) did not receive adjuvant radiation as the patient had to leave the country.   PLAN: Angell is now 4 years out from definitive surgery for her breast cancer with no evidence of disease recurrence.  This is very febrile.  She is tolerating letrozole well and the plan is to continue that a total of 5 years which means she will stop her treatment December 2022.  I offered to discharge her from follow-up at this point but she tells me she gets reassurance from coming here and I am setting her up to see our breast cancer survivorship nurse practitioner a year from now.  They know to call for any issue that may develop before the next visit  Total encounter time 25 minutes.*  Sho Salguero, Virgie Dad, MD  03/28/21 2:56 PM Medical Oncology and Hematology [facility] [address] [place], [place] [address] Tel. [phone]    Fax. [phone]    I, Wilburn Mylar, am acting as scribe for Dr. Virgie Dad. Eleah Lahaie.  I, Lurline Del  MD, have reviewed the above documentation for accuracy and completeness, and I agree with the above.    *Total Encounter Time as defined by the Centers for Medicare and Medicaid Services includes, in addition to the face-to-face time of a patient visit (documented in the note above) non-face-to-face time: obtaining and reviewing outside history, ordering and reviewing medications, tests or procedures, care coordination (communications with other health care professionals or caregivers) and documentation in the medical record.

## 2021-03-30 ENCOUNTER — Other Ambulatory Visit: Payer: Self-pay

## 2021-03-31 ENCOUNTER — Other Ambulatory Visit: Payer: Self-pay

## 2021-04-04 ENCOUNTER — Other Ambulatory Visit: Payer: Self-pay

## 2021-04-04 ENCOUNTER — Telehealth: Payer: Self-pay | Admitting: *Deleted

## 2021-04-11 ENCOUNTER — Ambulatory Visit: Payer: Self-pay | Admitting: Physician Assistant

## 2021-04-11 ENCOUNTER — Other Ambulatory Visit: Payer: Self-pay

## 2021-04-11 VITALS — BP 132/74 | HR 67 | Temp 98.2°F | Resp 18 | Ht 64.0 in | Wt 179.0 lb

## 2021-04-11 DIAGNOSIS — Z87898 Personal history of other specified conditions: Secondary | ICD-10-CM

## 2021-04-11 LAB — POCT GLYCOSYLATED HEMOGLOBIN (HGB A1C): Hemoglobin A1C: 6.1 % — AB (ref 4.0–5.6)

## 2021-04-11 NOTE — Patient Instructions (Signed)
Patient is aware of still being prediabetic and needing to adhere to low sugar and carb diet. Patient will have a recheck of cholesterol and diabetes are her PCP follow up.

## 2021-04-11 NOTE — Progress Notes (Signed)
Patient presents with dark area with no pain, tenderness, itching, or concern. Patient wanted DM checked for this concern.

## 2021-04-12 ENCOUNTER — Other Ambulatory Visit: Payer: Self-pay

## 2021-04-17 ENCOUNTER — Other Ambulatory Visit: Payer: Self-pay

## 2021-04-24 ENCOUNTER — Telehealth: Payer: Self-pay | Admitting: Nurse Practitioner

## 2021-04-24 NOTE — Telephone Encounter (Signed)
Spoke with daughter. Not sure how July flight would interfere with September appointment. She will speak with her mother again for more clarification

## 2021-04-24 NOTE — Telephone Encounter (Signed)
Patient daughter came in saying that the patient has a flight on 04/28/21 but the patient would like a letter from her PCP saying that she has an appointment on 06/23/21 and wants to change the flight. Patient is requesting the letter because she does not want to pay the penalty for changing the date on her flight. Please f/u.

## 2021-05-05 ENCOUNTER — Other Ambulatory Visit: Payer: Self-pay

## 2021-05-08 ENCOUNTER — Other Ambulatory Visit: Payer: Self-pay | Admitting: Family Medicine

## 2021-05-08 ENCOUNTER — Other Ambulatory Visit: Payer: Self-pay

## 2021-05-08 DIAGNOSIS — H1013 Acute atopic conjunctivitis, bilateral: Secondary | ICD-10-CM

## 2021-05-08 MED ORDER — OLOPATADINE HCL 0.2 % OP SOLN
1.0000 [drp] | Freq: Every day | OPHTHALMIC | 1 refills | Status: DC
  Filled 2021-05-08: qty 2.5, 50d supply, fill #0

## 2021-05-10 ENCOUNTER — Other Ambulatory Visit: Payer: Self-pay

## 2021-06-23 ENCOUNTER — Ambulatory Visit: Payer: Self-pay | Admitting: Nurse Practitioner

## 2021-06-30 ENCOUNTER — Encounter: Payer: Self-pay | Admitting: Internal Medicine

## 2021-08-01 NOTE — Telephone Encounter (Signed)
No entry 

## 2022-03-21 ENCOUNTER — Other Ambulatory Visit: Payer: Self-pay

## 2022-03-21 DIAGNOSIS — Z170 Estrogen receptor positive status [ER+]: Secondary | ICD-10-CM

## 2022-03-22 ENCOUNTER — Other Ambulatory Visit: Payer: Self-pay

## 2022-03-22 ENCOUNTER — Inpatient Hospital Stay: Payer: Self-pay | Admitting: Adult Health

## 2022-03-22 ENCOUNTER — Encounter: Payer: Self-pay | Admitting: Adult Health

## 2022-03-22 ENCOUNTER — Inpatient Hospital Stay: Payer: Self-pay | Attending: Nurse Practitioner

## 2022-04-27 ENCOUNTER — Encounter: Payer: Self-pay | Admitting: Internal Medicine

## 2022-10-10 ENCOUNTER — Other Ambulatory Visit: Payer: Self-pay

## 2022-10-10 ENCOUNTER — Other Ambulatory Visit (HOSPITAL_COMMUNITY): Payer: Self-pay

## 2022-10-10 MED ORDER — LATANOPROST 0.005 % OP SOLN
OPHTHALMIC | 7 refills | Status: DC
  Filled 2022-10-10: qty 5, 100d supply, fill #0

## 2022-10-10 MED ORDER — LATANOPROST 0.005 % OP SOLN
1.0000 [drp] | Freq: Every day | OPHTHALMIC | 7 refills | Status: DC
  Filled 2022-10-10: qty 7.5, 90d supply, fill #0
  Filled 2022-11-15: qty 20, 400d supply, fill #0
  Filled 2022-11-15: qty 7.5, 150d supply, fill #0
  Filled 2022-11-15: qty 20, 400d supply, fill #0

## 2022-10-17 ENCOUNTER — Ambulatory Visit (INDEPENDENT_AMBULATORY_CARE_PROVIDER_SITE_OTHER): Payer: Self-pay | Admitting: Physician Assistant

## 2022-10-17 ENCOUNTER — Encounter: Payer: Self-pay | Admitting: Physician Assistant

## 2022-10-17 ENCOUNTER — Ambulatory Visit: Payer: Self-pay

## 2022-10-17 ENCOUNTER — Other Ambulatory Visit: Payer: Self-pay

## 2022-10-17 VITALS — BP 129/82 | HR 84 | Wt 195.0 lb

## 2022-10-17 DIAGNOSIS — I10 Essential (primary) hypertension: Secondary | ICD-10-CM

## 2022-10-17 DIAGNOSIS — R0602 Shortness of breath: Secondary | ICD-10-CM

## 2022-10-17 DIAGNOSIS — J209 Acute bronchitis, unspecified: Secondary | ICD-10-CM

## 2022-10-17 DIAGNOSIS — R0789 Other chest pain: Secondary | ICD-10-CM

## 2022-10-17 MED ORDER — AMLODIPINE BESYLATE 5 MG PO TABS
2.5000 mg | ORAL_TABLET | Freq: Every day | ORAL | 2 refills | Status: DC
  Filled 2022-10-17: qty 15, 30d supply, fill #0

## 2022-10-17 MED ORDER — ALBUTEROL SULFATE HFA 108 (90 BASE) MCG/ACT IN AERS
1.0000 | INHALATION_SPRAY | Freq: Four times a day (QID) | RESPIRATORY_TRACT | 0 refills | Status: DC | PRN
  Filled 2022-10-17: qty 6.7, 25d supply, fill #0

## 2022-10-17 MED ORDER — ALBUTEROL SULFATE HFA 108 (90 BASE) MCG/ACT IN AERS
1.0000 | INHALATION_SPRAY | Freq: Four times a day (QID) | RESPIRATORY_TRACT | 0 refills | Status: DC | PRN
  Filled 2022-10-17: qty 18, fill #0

## 2022-10-17 MED ORDER — PREDNISONE 20 MG PO TABS
ORAL_TABLET | ORAL | 0 refills | Status: AC
  Filled 2022-10-17: qty 13, 8d supply, fill #0

## 2022-10-17 NOTE — Progress Notes (Unsigned)
Established Patient Office Visit  Subjective   Patient ID: Jill Adkins, female    DOB: 1950-11-27  Age: 72 y.o. MRN: [id number]  Chief Complaint  Patient presents with   Cough    X2 months, patient given inhaler, Teva salbutamol as need, patient states medication is not working    Wheezing    With chest pain.     Sister is present and helps with interpretation.  States that she started having a productive cough with yellow sputum along with wheezing and bodyaches approximately 2 months ago.  States that she was traveling in [place] at the time.  States that she was seen by a medical provider in [place] and was given an albuterol inhaler to use.  States that she has improved, however her cough is still present, states that she has tenderness in the center of her chest.  States that she has finished the albuterol inhaler.  No antibiotics were given, no other medications used for relief.    Requests refill of amlodipine as well.  Does not check BP at home.    Past Medical History:  Diagnosis Date   Allergy    Anemia    Arthritis    Breast cancer ([place])    right breast   Cataracts, bilateral    GERD (gastroesophageal reflux disease)    Glaucoma    Headache    History of anemia    Hyperlipidemia    Hypertension    Seasonal allergies    Social History   Socioeconomic History   Marital status: Widowed    Spouse name: Not on file   Number of children: Not on file   Years of education: Not on file   Highest education level: Not on file  Occupational History   Not on file  Tobacco Use   Smoking status: Never   Smokeless tobacco: Never  Vaping Use   Vaping Use: Never used  Substance and Sexual Activity   Alcohol use: No   Drug use: No   Sexual activity: Not Currently    Partners: Male  Other Topics Concern   Not on file  Social History Narrative   Not on file   Social Determinants of Health   Financial Resource Strain: Not on file  Food Insecurity: Not on file   Transportation Needs: Not on file  Physical Activity: Not on file  Stress: Not on file  Social Connections: Not on file  Intimate Partner Violence: Not on file   Family History  Problem Relation Age of Onset   Hypertension Father    Colon cancer Brother 53   Esophageal cancer Neg Hx    Rectal cancer Neg Hx    Stomach cancer Neg Hx    Liver cancer Neg Hx    Pancreatic cancer Neg Hx    Allergies  Allergen Reactions   No Known Allergies    Shrimp [Shellfish Allergy]     Headache, unable to talk    Review of Systems  Constitutional:  Negative for chills and fever.  HENT:  Positive for congestion. Negative for ear pain and sore throat.   Eyes: Negative.   Respiratory:  Positive for cough and wheezing. Negative for shortness of breath.   Cardiovascular:  Negative for chest pain.  Gastrointestinal:  Negative for nausea and vomiting.  Genitourinary: Negative.   Musculoskeletal:  Positive for myalgias.  Skin: Negative.   Neurological: Negative.   Endo/Heme/Allergies: Negative.   Psychiatric/Behavioral: Negative.        Objective:  BP 129/82 (BP Location: Left Arm, Patient Position: Sitting, Cuff Size: Large)   Pulse 84   Wt 195 lb (88.5 kg)   SpO2 93%   BMI 33.47 kg/m    Physical Exam Vitals and nursing note reviewed.  Constitutional:      Appearance: Normal appearance.  HENT:     Head: Normocephalic and atraumatic.     Right Ear: Tympanic membrane, ear canal and external ear normal.     Left Ear: Tympanic membrane, ear canal and external ear normal.     Nose: Nose normal.     Mouth/Throat:     Mouth: Mucous membranes are moist.     Pharynx: Oropharynx is clear. No oropharyngeal exudate.  Eyes:     Extraocular Movements: Extraocular movements intact.     Conjunctiva/sclera: Conjunctivae normal.     Pupils: Pupils are equal, round, and reactive to light.  Cardiovascular:     Rate and Rhythm: Normal rate and regular rhythm.     Pulses: Normal pulses.      Heart sounds: Normal heart sounds.  Pulmonary:     Effort: Pulmonary effort is normal.     Breath sounds: Wheezing present.     Comments: Slight wheezing noted bilaterally Chest:     Chest wall: Tenderness present.     Comments: Tenderness on palpation Musculoskeletal:        General: Normal range of motion.     Cervical back: Normal range of motion and neck supple.  Skin:    General: Skin is warm and dry.  Neurological:     General: No focal deficit present.     Mental Status: She is alert and oriented to person, place, and time.  Psychiatric:        Mood and Affect: Mood normal.        Behavior: Behavior normal.        Thought Content: Thought content normal.        Judgment: Judgment normal.        Assessment & Plan:   Problem List Items Addressed This Visit       Cardiovascular and Mediastinum   Essential hypertension   Relevant Medications   amLODipine (NORVASC) 5 MG tablet   Other Visit Diagnoses     Acute bronchitis, unspecified organism    -  Primary   Relevant Medications   predniSONE (DELTASONE) 20 MG tablet   albuterol (PROVENTIL HFA) 108 (90 Base) MCG/ACT inhaler   SOB (shortness of breath) on exertion       Relevant Medications   albuterol (PROVENTIL HFA) 108 (90 Base) MCG/ACT inhaler   Chest wall tenderness          1. Acute bronchitis, unspecified organism Trial prednisone, continue albuterol as needed.  Patient education given on supportive care, red flags given for prompt reevaluation - predniSONE (DELTASONE) 20 MG tablet; Take 3 tablets (60 mg total) by mouth daily with breakfast for 2 days, THEN 2 tablets (40 mg total) daily with breakfast for 2 days, THEN 1 tablet (20 mg total) daily with breakfast for 2 days, THEN 0.5 tablets (10 mg total) daily with breakfast for 2 days.  Dispense: 13 tablet; Refill: 0 - albuterol (PROVENTIL HFA) 108 (90 Base) MCG/ACT inhaler; Inhale 1-2 puffs into the lungs every 6 (six) hours as needed for wheezing or  shortness of breath.  Dispense: 6.7 g; Refill: 0  2. SOB (shortness of breath) on exertion  - albuterol (PROVENTIL HFA) 108 (90 Base) MCG/ACT inhaler; Inhale  1-2 puffs into the lungs every 6 (six) hours as needed for wheezing or shortness of breath.  Dispense: 6.7 g; Refill: 0  3. Essential hypertension Continue - amLODipine (NORVASC) 5 MG tablet; Take 0.5 tablets (2.5 mg total) by mouth daily.  Dispense: 15 tablet; Refill: 2  4.  Chest wall tenderness  Reassurance given, prednisone taper should help offer relief.  Patient education given on supportive care.   I have reviewed the patient's medical history (PMH, PSH, Social History, Family History, Medications, and allergies) , and have been updated if relevant. I spent 30 minutes reviewing chart and  face to face time with patient.    Return if symptoms worsen or fail to improve.    Loraine Grip Mayers, PA-C

## 2022-10-17 NOTE — Telephone Encounter (Signed)
    Chief Complaint: Cough with wheezing Symptoms: Above Frequency: 1 month Pertinent Negatives: Patient denies fever Disposition: '[]'$ ED /'[]'$ Urgent Care (no appt availability in office) / '[x]'$ Appointment(In office/virtual)/ '[]'$  [place] [facility]/ '[]'$ Home Care/ '[]'$ Refused Recommended Disposition /'[]'$ [place] [facility]/ '[]'$  Follow-up with PCP Additional Notes:   Reason for Disposition  [1] MILD difficulty breathing (e.g., minimal/no SOB at rest, SOB with walking, pulse <100) AND [2] NEW-onset or WORSE than normal  Answer Assessment - Initial Assessment Questions 1. RESPIRATORY STATUS: "Describe your breathing?" (e.g., wheezing, shortness of breath, unable to speak, severe coughing)      Wheezing, cough - mucus 2. ONSET: "When did this breathing problem begin?"      1 month ago 3. PATTERN "Does the difficult breathing come and go, or has it been constant since it started?"      Comes and goes 4. SEVERITY: "How bad is your breathing?" (e.g., mild, moderate, severe)    - MILD: No SOB at rest, mild SOB with walking, speaks normally in sentences, can lie down, no retractions, pulse < 100.    - MODERATE: SOB at rest, SOB with minimal exertion and prefers to sit, cannot lie down flat, speaks in phrases, mild retractions, audible wheezing, pulse 100-120.    - SEVERE: Very SOB at rest, speaks in single words, struggling to breathe, sitting hunched forward, retractions, pulse > 120      Mild 5. RECURRENT SYMPTOM: "Have you had difficulty breathing before?" If Yes, ask: "When was the last time?" and "What happened that time?"      No 6. CARDIAC HISTORY: "Do you have any history of heart disease?" (e.g., heart attack, angina, bypass surgery, angioplasty)      No 7. LUNG HISTORY: "Do you have any history of lung disease?"  (e.g., pulmonary embolus, asthma, emphysema)     No 8. CAUSE: "What do you think is causing the breathing problem?"      Unsure 9. OTHER SYMPTOMS: "Do you have any other  symptoms? (e.g., dizziness, runny nose, cough, chest pain, fever)     Cough 10. O2 SATURATION MONITOR:  "Do you use an oxygen saturation monitor (pulse oximeter) at home?" If Yes, ask: "What is your reading (oxygen level) today?" "What is your usual oxygen saturation reading?" (e.g., 95%)       No 11. PREGNANCY: "Is there any chance you are pregnant?" "When was your last menstrual period?"       No 12. TRAVEL: "Have you traveled out of the country in the last month?" (e.g., travel history, exposures)       No  Protocols used: Breathing Difficulty-A-AH

## 2022-10-17 NOTE — Patient Instructions (Signed)
To help with your wheezing, you are going to do a steroid taper.  I refilled the inhaler and sent it to the pharmacy.  I hope that you feel better soon, please let us know if there is anything else we can do for you  Kennieth Rad, PA-C Physician Assistant [email or web address]   Bronchite aigu chez l'adulte Acute Bronchitis, Adult  La bronchite aigu est une inflammation soudaine des voies respiratoires principales (bronches) qui vont du conduit qui sert  respirer (trache) aux poumons. Le gonflement rduit la taille des voies respiratoires et entrane une production de mucus suprieure  la normale. Cela peut rendre la respiration difficile et provoquer une toux ou une respiration bruyante (sifflante). La bronchite aigu peut durer [employer]. La toux peut durer plus longtemps. Les allergies, l'asthme et l'exposition  la Editor, commissioning. Quelles sont les causes ? Cette affection peut tre cause par des germes et des substances qui irritent les poumons,  savoir : Les virus du rhume et de la grippe. Cette affection est le plus souvent cause par le virus responsable du rhume. Des bactries. Cette cause est moins frquente. L'inhalation de substances irritantes pour les poumons, notamment : La fume de cigarettes et d'autres formes de tabac. La poussire et le pollen. Les [employer] provenant de produits Psychiatrist, de gaz ou de combustibles brls. La pollution de Programmer, applications, que ce Restaurant manager, fast food ou  l'extrieur. Quels sont les facteurs qui augmentent le risque ? Les facteurs suivants peuvent augmenter le risque de dvelopper cette affection : Un systme de dfense corporelle, galement appel systme immunitaire, faible. Une affection qui touche les poumons et la fonction respiratoire, comme l'asthme. Quels sont les signes ou symptmes ? Les causes courantes de cette affection  incluent : Une toux. En toussant, vous pourriez cracher du mucus clair, jaune ou vert provenant de vos poumons (expectoration). Une respiration sifflante. Le nez bouch ou qui coule. Un excs de mucus dans les poumons (congestion thoracique). Un essoufflement. Des maux et des [employer], y compris des maux de gorge et des [employer] niveau de la poitrine. Comment se fait le diagnostic ? Le diagnostic de cette affection repose gnralement sur : Vos symptmes et vos antcdents mdicaux. Un examen clinique. Vous pourriez galement passer d'autres examens, notamment pour exclure d'autres affections, comme une pneumonie. Ces examens pourront comprendre : Un test de la fonction pulmonaire. L'analyse d'un chantillon de mucus pour rechercher la prsence de bactries. Des examens pour vrifier votre taux [employer] sang. Des analyses de sang. Une radiographie du thorax. Comment cette affection est-elle traite ? La plupart des cas de bronchite aigu disparaissent [employer], sans traitement. Votre prestataire de soins de sant pourra recommander : De boire davantage de liquides afin d'aider  fluidifier votre mucus et faciliter son expectoration. De prendre des [employer]  inhaler ( l'aide Solicitor) pour [employer] dbit d'air entrant et sortant de vos poumons. D'utiliser un vaporisateur ou un humidificateur. Ce sont des appareils qui ajoutent de l'eau  l'air et vous aident ainsi  mieux respirer. De prendre un mdicament qui fluidifie le mucus et limine la congestion (expectorant). De prendre un mdicament qui prvient ou fait cesser la toux (antitussif). Il n'est pasfrquent de traiter cette affection par un antibiotique. [place] les instructions suivantes  domicile :  Engineer, civil (consulting) vos mdicaments en vente libre et sur ordonnance en suivant scrupuleusement les instructions de votre prestataire de soins de sant. Utilisez un inhalateur, un vaporisateur ou un humidificateur  en  suivant les recommandations de votre prestataire de soins de sant. Prenez deux cuillres  caf (10 ml) de miel au moment de vous coucher pour rduire votre toux [employer]. Buvez des quantits suffisantes de liquides pour garder votre urine jaune ple. N'utilisez pas de produits contenant de la nicotine ou du tabac. Il s'agit notamment des cigarettes, du tabac  mcher et des vapoteuses, comme les cigarettes lectroniques. Si vous avez besoin d'aide pour arrter de fumer, demandez conseil  votre prestataire de soins de sant. Veillez  vous reposer suffisamment. Reprenez vos activits habituelles en veillant  suivre les recommandations de votre prestataire de soins de sant. Demandez  votre prestataire de soins de sant quelles activits sont sans danger pour vous. Rendez-vous  toutes les visites de suivi. C'est important. Comment viter cette affection ? Pour rduire Geneticist, molecular de Diplomatic Services operational officer  nouveau cette affection : [employer] mains avec de l'eau et du savon pendant au moins 20 secondes. Si vous n'avez pas de savon et d'eau  disposition, utilisez un dsinfectant Health Net. vitez les contacts [employer] qui prsentent des [employer] rhume. Essayez d'viter de vous toucher la bouche, le nez ou les yeux [employer]. vitez d'inhaler de la fume ou des Limited Brands. Le fait de respirer de la fume ou des manations chimiques aggravera votre tat. Faites-vous vacciner contre la grippe chaque anne. Prenez contact avec un prestataire de soins de sant si : Vos symptmes ne s'amliorent pas au bout de 2 semaines. Vous avez du mal  expectorer le mucus. Votre toux vous empche de dormir la nuit. Vous avez de la fivre. Vous devez consulter immdiatement si vous : Environmental education officer sang. Ressentez une [employer] poitrine. Prsentez un essoufflement grave. Vous vanouissez ou si vous avez l'impression que vous allez vous vanouir. Avez des [employer] de tte  intenses. Avez de la fivre ou des frissons et si cela s'aggrave. Ces symptmes peuvent tre le signe d'un problme grave et Statistician situation Engineer, site. N'attendez pas de voir si les symptmes disparaissent. Vous devez consulter immdiatement un mdecin. Appelez les Administrator, Civil Service locaux (911 aux [place]). Ne prenez Chief Technology Officer, faites-vous accompagner  l'hpital. Rsum La bronchite aigu est une inflammation des voies respiratoires principales (bronches) qui vont du conduit qui sert  respirer (trache) aux poumons. Le gonflement rduit la taille des voies respiratoires et entrane une production de mucus suprieure  la normale. Boire davantage de Barista votre mucus et  faciliter son expectoration. Prenez vos mdicaments en vente libre et sur ordonnance en suivant scrupuleusement les instructions de votre prestataire de soins de sant. N'utilisez pas de produits contenant de la nicotine ou du tabac. Il s'agit notamment des cigarettes, du tabac  mcher et des vapoteuses, comme les cigarettes lectroniques. Si vous avez besoin d'aide pour arrter de fumer, demandez conseil  votre prestataire de soins de sant. Contactez un prestataire de soins de sant si vos symptmes ne s'amliorent pas au bout de 2 semaines. Ces conseils et renseignements ne sauraient se substituer  l'avis mdical de votre prestataire de soins de sant. Par consquent, il est primordial de parler de toutes vos proccupations avec votre prestataire de soins de sant. Document Revised: 02/13/2021 Document Reviewed: 02/13/2021 Elsevier Patient Education  [place].

## 2022-10-18 ENCOUNTER — Other Ambulatory Visit: Payer: Self-pay

## 2022-10-18 ENCOUNTER — Encounter: Payer: Self-pay | Admitting: Physician Assistant

## 2022-10-31 ENCOUNTER — Other Ambulatory Visit: Payer: Self-pay

## 2022-10-31 ENCOUNTER — Other Ambulatory Visit: Payer: Self-pay | Admitting: Physician Assistant

## 2022-10-31 ENCOUNTER — Ambulatory Visit: Payer: Self-pay

## 2022-10-31 DIAGNOSIS — I10 Essential (primary) hypertension: Secondary | ICD-10-CM

## 2022-10-31 NOTE — Telephone Encounter (Signed)
  Chief Complaint: mid chest chest pain Symptoms: radiating to back, dizziness, headache Frequency: 2 days Pertinent Negatives: Patient denies dizziness, vomiting, nausea, sweating, fever, difficulty breathing, cough Disposition: '[x]'$ ED /'[]'$ Urgent Care (no appt availability in office) / '[]'$ Appointment(In office/virtual)/ '[]'$  [place] [facility]/ '[]'$ Home Care/ '[]'$ Refused Recommended Disposition /'[]'$ [place] [facility]/ '[]'$  Follow-up with PCP Additional Notes: Unsure if pt will go Reason for Disposition  Dizziness or lightheadedness  Answer Assessment - Initial Assessment Questions 1. LOCATION: "Where does it hurt?"       middle 2. RADIATION: "Does the pain go anywhere else?" (e.g., into neck, jaw, arms, back)     Back  3. ONSET: "When did the chest pain begin?" (Minutes, hours or days)      Last night 4. PATTERN: "Does the pain come and go, or has it been constant since it started?"  "Does it get worse with exertion?"      Before yest comes goes now constant  5. DURATION: "How long does it last" (e.g., seconds, minutes, hours)     constant 6. SEVERITY: "How bad is the pain?"  (e.g., Scale 1-10; mild, moderate, or severe)    - MILD (1-3): doesn't interfere with normal activities     - MODERATE (4-7): interferes with normal activities or awakens from sleep    - SEVERE (8-10): excruciating pain, unable to do any normal activities       moderate 7. CARDIAC RISK FACTORS: "Do you have any history of heart problems or risk factors for heart disease?" (e.g., angina, prior heart attack; diabetes, high blood pressure, high cholesterol, smoker, or strong family history of heart disease)     *No Answer* 8. PULMONARY RISK FACTORS: "Do you have any history of lung disease?"  (e.g., blood clots in lung, asthma, emphysema, birth control pills)     N/a 9. CAUSE: "What do you think is causing the chest pain?"     N/a 10. OTHER SYMPTOMS: "Do you have any other symptoms?" (e.g., dizziness, nausea,  vomiting, sweating, fever, difficulty breathing, cough)       Dizziness, headache 11. PREGNANCY: "Is there any chance you are pregnant?" "When was your last menstrual period?"       *No Answer*  Protocols used: Chest Pain-A-AH

## 2022-11-05 ENCOUNTER — Ambulatory Visit (HOSPITAL_COMMUNITY)
Admission: RE | Admit: 2022-11-05 | Discharge: 2022-11-05 | Disposition: A | Discharge status: Home / Self Care | Payer: Self-pay | Source: Ambulatory Visit | Attending: Physician Assistant | Admitting: Physician Assistant

## 2022-11-05 ENCOUNTER — Other Ambulatory Visit: Payer: Self-pay

## 2022-11-05 ENCOUNTER — Encounter: Payer: Self-pay | Admitting: Physician Assistant

## 2022-11-05 ENCOUNTER — Ambulatory Visit: Payer: Self-pay | Admitting: Physician Assistant

## 2022-11-05 VITALS — BP 137/80 | HR 90 | Ht 60.24 in | Wt 190.0 lb

## 2022-11-05 DIAGNOSIS — J302 Other seasonal allergic rhinitis: Secondary | ICD-10-CM

## 2022-11-05 DIAGNOSIS — R062 Wheezing: Secondary | ICD-10-CM | POA: Insufficient documentation

## 2022-11-05 DIAGNOSIS — I10 Essential (primary) hypertension: Secondary | ICD-10-CM

## 2022-11-05 DIAGNOSIS — Z87898 Personal history of other specified conditions: Secondary | ICD-10-CM

## 2022-11-05 DIAGNOSIS — J209 Acute bronchitis, unspecified: Secondary | ICD-10-CM

## 2022-11-05 DIAGNOSIS — R7303 Prediabetes: Secondary | ICD-10-CM

## 2022-11-05 DIAGNOSIS — E6609 Other obesity due to excess calories: Secondary | ICD-10-CM

## 2022-11-05 DIAGNOSIS — Z6836 Body mass index (BMI) 36.0-36.9, adult: Secondary | ICD-10-CM

## 2022-11-05 DIAGNOSIS — R0602 Shortness of breath: Secondary | ICD-10-CM

## 2022-11-05 LAB — POCT GLYCOSYLATED HEMOGLOBIN (HGB A1C): Hemoglobin A1C: 6.1 % — AB (ref 4.0–5.6)

## 2022-11-05 MED ORDER — AZITHROMYCIN 250 MG PO TABS
ORAL_TABLET | ORAL | 0 refills | Status: AC
  Filled 2022-11-05: qty 6, 5d supply, fill #0

## 2022-11-05 MED ORDER — AMLODIPINE BESYLATE 5 MG PO TABS
5.0000 mg | ORAL_TABLET | Freq: Every day | ORAL | 0 refills | Status: DC
  Filled 2022-11-05: qty 90, 90d supply, fill #0

## 2022-11-05 MED ORDER — ALBUTEROL SULFATE HFA 108 (90 BASE) MCG/ACT IN AERS
1.0000 | INHALATION_SPRAY | Freq: Four times a day (QID) | RESPIRATORY_TRACT | 0 refills | Status: DC | PRN
  Filled 2022-11-05 – 2022-11-08 (×2): qty 6.7, 25d supply, fill #0

## 2022-11-05 MED ORDER — CETIRIZINE HCL 10 MG PO TABS
10.0000 mg | ORAL_TABLET | Freq: Every day | ORAL | 11 refills | Status: DC
  Filled 2022-11-05: qty 30, 30d supply, fill #0

## 2022-11-05 NOTE — Patient Instructions (Addendum)
You are going to take azithromycin for 5 days.  I encourage you to monitor your use of your inhaler, keep a written log of how many times you are having to use it and of the nighttime awakenings needing to use it.  I also encourage you to start taking Zyrtec on a daily basis.  I encourage you to increase your water intake, you should be drinking at least 2 L of water a day.  Your A1c is 6.1, this is considered pre-diabetes   We will call you with the results of your x-ray as soon as it is available.  Kennieth Rad, PA-C Physician Assistant [facility] [email or web address]

## 2022-11-05 NOTE — Progress Notes (Unsigned)
   Established Patient Office Visit  Subjective   Patient ID: Jill Adkins, female    DOB: 1951/03/23  Age: 72 y.o. MRN: [id number]  Chief Complaint  Patient presents with   Follow-up    Asthma  C/o of pain in chest, back, head ache     States that she started having some relief with the steroid - but states that it has returned   Lays down - can hear the wheezing  Hard to sleep and BP elevated  with prednisone  Did take antibiotic when it first started when and an inhaler  Still using the two time before sleep - during the night when cough is bothering her      {History (Optional):23778}  ROS    Objective:     Ht 5' 0.24" (1.53 m)   Wt 190 lb (86.2 kg)   BMI 36.82 kg/m  {Vitals History (Optional):23777}  Physical Exam   No results found for any visits on 11/05/22.  {Labs (Optional):23779}  The 10-year ASCVD risk score (Arnett DK, et al., 2019) is: 12.4%    Assessment & Plan:   Problem List Items Addressed This Visit   None  Astham vs bronchitis  No follow-ups on file.    Loraine Grip Mayers, PA-C

## 2022-11-08 ENCOUNTER — Other Ambulatory Visit: Payer: Self-pay

## 2022-11-09 ENCOUNTER — Telehealth: Payer: Self-pay | Admitting: Nurse Practitioner

## 2022-11-09 ENCOUNTER — Other Ambulatory Visit: Payer: Self-pay

## 2022-11-09 NOTE — Telephone Encounter (Signed)
Spoke with Patient daughter.  Verified that Daughter has permission the receive information as it relates to patient.  Daughter  identified patient by birth date. X ray results  given to patient 's daughter per PCP "x-ray did not show any active disease.  Both lungs are clear.  She should continue current treatment plan. " Patient Daughter denies any questions at this time.

## 2022-11-09 NOTE — Telephone Encounter (Signed)
Pts daughter [name] is calling for results of xray - DG Chest 2 View (Accession [id number]) (Order [id number])

## 2022-11-15 ENCOUNTER — Other Ambulatory Visit: Payer: Self-pay

## 2022-11-16 ENCOUNTER — Other Ambulatory Visit: Payer: Self-pay

## 2022-11-21 ENCOUNTER — Institutional Professional Consult (permissible substitution): Payer: Self-pay | Admitting: Pulmonary Disease

## 2022-11-22 ENCOUNTER — Telehealth: Payer: Self-pay | Admitting: Nurse Practitioner

## 2022-11-22 ENCOUNTER — Other Ambulatory Visit: Payer: Self-pay | Admitting: Physician Assistant

## 2022-11-22 ENCOUNTER — Other Ambulatory Visit: Payer: Self-pay

## 2022-11-22 DIAGNOSIS — J302 Other seasonal allergic rhinitis: Secondary | ICD-10-CM

## 2022-11-22 MED ORDER — FLUTICASONE PROPIONATE 50 MCG/ACT NA SUSP
2.0000 | Freq: Every day | NASAL | 6 refills | Status: DC
  Filled 2022-11-22: qty 16, 25d supply, fill #0

## 2022-11-22 NOTE — Telephone Encounter (Signed)
Daughter aware that Rx was sent to pharmacy.

## 2022-11-22 NOTE — Telephone Encounter (Signed)
Patient's daughter called because patient wants to know if she can be re-prescribed fluticasone (FLONASE) 50 MCG/ACT nasal spray.

## 2022-11-22 NOTE — Progress Notes (Signed)
Courtesy refill sent  Kennieth Rad, PA-C Physician Assistant [facility] [email or web address]

## 2024-04-15 ENCOUNTER — Other Ambulatory Visit: Payer: Self-pay

## 2024-04-15 MED ORDER — LATANOPROST 0.005 % OP SOLN
1.0000 [drp] | Freq: Every evening | OPHTHALMIC | 0 refills | Status: AC
  Filled 2024-04-15: qty 2.5, 25d supply, fill #0

## 2024-04-16 ENCOUNTER — Other Ambulatory Visit: Payer: Self-pay

## 2024-06-04 ENCOUNTER — Other Ambulatory Visit (HOSPITAL_COMMUNITY): Payer: Self-pay

## 2024-06-04 MED ORDER — TIMOLOL MALEATE 0.25 % OP SOLN
1.0000 [drp] | OPHTHALMIC | 4 refills | Status: AC
  Filled 2024-06-04: qty 15, 90d supply, fill #0
  Filled 2024-06-05: qty 15, 300d supply, fill #0
  Filled 2024-07-06: qty 5, 100d supply, fill #1

## 2024-06-05 ENCOUNTER — Other Ambulatory Visit: Payer: Self-pay

## 2024-06-05 ENCOUNTER — Other Ambulatory Visit (HOSPITAL_COMMUNITY): Payer: Self-pay

## 2024-06-09 ENCOUNTER — Other Ambulatory Visit: Payer: Self-pay

## 2024-06-23 ENCOUNTER — Ambulatory Visit: Payer: Self-pay | Attending: Nurse Practitioner | Admitting: Nurse Practitioner

## 2024-06-23 ENCOUNTER — Other Ambulatory Visit: Payer: Self-pay

## 2024-06-23 ENCOUNTER — Encounter: Payer: Self-pay | Admitting: Nurse Practitioner

## 2024-06-23 VITALS — BP 115/76 | HR 69 | Resp 20 | Ht 60.24 in | Wt 186.0 lb

## 2024-06-23 DIAGNOSIS — R519 Headache, unspecified: Secondary | ICD-10-CM

## 2024-06-23 DIAGNOSIS — F5101 Primary insomnia: Secondary | ICD-10-CM

## 2024-06-23 DIAGNOSIS — K219 Gastro-esophageal reflux disease without esophagitis: Secondary | ICD-10-CM

## 2024-06-23 DIAGNOSIS — R7303 Prediabetes: Secondary | ICD-10-CM

## 2024-06-23 DIAGNOSIS — I10 Essential (primary) hypertension: Secondary | ICD-10-CM

## 2024-06-23 DIAGNOSIS — E782 Mixed hyperlipidemia: Secondary | ICD-10-CM

## 2024-06-23 DIAGNOSIS — Z79899 Other long term (current) drug therapy: Secondary | ICD-10-CM

## 2024-06-23 DIAGNOSIS — Z1231 Encounter for screening mammogram for malignant neoplasm of breast: Secondary | ICD-10-CM

## 2024-06-23 DIAGNOSIS — J302 Other seasonal allergic rhinitis: Secondary | ICD-10-CM

## 2024-06-23 MED ORDER — AMLODIPINE BESYLATE 5 MG PO TABS
5.0000 mg | ORAL_TABLET | Freq: Every day | ORAL | 0 refills | Status: DC
Start: 2024-06-23 — End: 2024-07-14
  Filled 2024-06-23: qty 90, 90d supply, fill #0

## 2024-06-23 MED ORDER — ALBUTEROL SULFATE HFA 108 (90 BASE) MCG/ACT IN AERS
1.0000 | INHALATION_SPRAY | Freq: Four times a day (QID) | RESPIRATORY_TRACT | 0 refills | Status: AC | PRN
  Filled 2024-06-23: qty 6.7, 25d supply, fill #0

## 2024-06-23 MED ORDER — FLUTICASONE PROPIONATE 50 MCG/ACT NA SUSP
2.0000 | Freq: Every day | NASAL | 6 refills | Status: AC
  Filled 2024-06-23: qty 16, 30d supply, fill #0

## 2024-06-23 MED ORDER — CETIRIZINE HCL 10 MG PO TABS
10.0000 mg | ORAL_TABLET | Freq: Every day | ORAL | 11 refills | Status: DC
  Filled 2024-06-23: qty 30, 30d supply, fill #0

## 2024-06-23 MED ORDER — TOPIRAMATE 25 MG PO CPSP
25.0000 mg | ORAL_CAPSULE | Freq: Every day | ORAL | 1 refills | Status: DC
  Filled 2024-06-23: qty 30, 30d supply, fill #0

## 2024-06-23 MED ORDER — OMEPRAZOLE 20 MG PO CPDR
20.0000 mg | DELAYED_RELEASE_CAPSULE | Freq: Every day | ORAL | 3 refills | Status: DC
  Filled 2024-06-23: qty 30, 30d supply, fill #0

## 2024-06-23 NOTE — Progress Notes (Signed)
 Assessment & Plan:  [name] was seen today for hypertension.  Diagnoses and all orders for this visit:  Primary hypertension -     CMP14+EGFR -     amLODipine  (NORVASC ) 5 MG tablet; Take 1 tablet (5 mg total) by mouth daily. For blood pressure INSTRUCTIONS: Avoid GERD Triggers: acidic, spicy or fried foods, caffeine, coffee, sodas,  alcohol and chocolate.    Breast cancer screening by mammogram -     MS 3D SCR MAMMO BILAT BR (aka MM); Future  Primary insomnia -     CBC with Differential/Platelet -     VITAMIN D  25 Hydroxy (Vit-D Deficiency, Fractures) -     Thyroid  Panel With TSH  Seasonal allergies -     albuterol  (PROVENTIL  HFA) 108 (90 Base) MCG/ACT inhaler; Inhale 1-2 puffs into the lungs every 6 (six) hours as needed for wheezing or shortness of breath. -     cetirizine  (ZYRTEC  ALLERGY) 10 MG tablet; Take 1 tablet (10 mg total) by mouth daily. For allergies -     fluticasone  (FLONASE ) 50 MCG/ACT nasal spray; Place 2 sprays into both nostrils daily. For allergies  Frequent headaches -     topiramate  (TOPAMAX ) 25 MG capsule; Take 1 capsule (25 mg total) by mouth daily. FOR HEADACHES Assessment and Plan Chronic headaches at night, possibly linked to uncontrolled glaucoma. MRI normal. - Start migraine medication. - Refer to neurology if ineffective after insurance renewal. Insomnia possibly linked to chronic headaches. Advised against prescription sleep medication. - Recommend over-the-counter Sleepy Time tea with chamomile. - Recommend melatonin up to 10 mg. - Advise trying natural remedies before ashwagandha.  Mixed hyperlipidemia -     Lipid panel She declined rosuvastatin  refill until new cholesterol levels reviewed. - Check cholesterol levels.  GERD without esophagitis -     omeprazole  (PRILOSEC) 20 MG capsule; Take 1 capsule (20 mg total) by mouth daily. For acid reflux INSTRUCTIONS: Avoid GERD Triggers: acidic, spicy or fried foods, caffeine, coffee, sodas,  alcohol  and chocolate.     Prediabetes -     CMP14+EGFR -     Hemoglobin A1c    Patient has been counseled on age-appropriate routine health concerns for screening and prevention. These are reviewed and up-to-date. Referrals have been placed accordingly. Immunizations are up-to-date or declined.    Subjective:   Chief Complaint  Patient presents with   Hypertension    History of Present Illness Jill Adkins is a 73 year old female who presents for hypertension follow-up and with persistent headaches and sleep disturbances.  She is accompanied by her daughter today who is translating for her.    She has been experiencing persistent headaches for over a year, primarily occurring at night and affecting her ability to sleep. Although she also reports when she does not get enough sleep it causes her to have headaches. The headaches worsen when she lies down and occur several times a week. She has been using Advil at home for relief.  She has a history of chronic headaches and underwent an MRI in the past, which did not reveal any significant findings. She reports that she went to the eye doctor recently and was told she has glaucoma and cataracts  She struggles with sleep even without headaches and has not been using any specific sleep aids regularly.  She mentions experiencing a sensation akin to shortness of breath when she does not sleep enough    Blood pressure is well-controlled with amlodipine  5 mg daily BP  Readings from Last 3 Encounters:  06/23/24 115/76  11/05/22 137/80  10/17/22 129/82         Review of Systems  Constitutional:  Negative for fever, malaise/fatigue and weight loss.  HENT: Negative.  Negative for nosebleeds.   Eyes: Negative.  Negative for blurred vision, double vision and photophobia.  Respiratory: Negative.  Negative for cough and shortness of breath.   Cardiovascular: Negative.  Negative for chest pain, palpitations and leg swelling.  Gastrointestinal:   Positive for heartburn. Negative for nausea and vomiting.  Musculoskeletal: Negative.  Negative for myalgias.  Neurological:  Positive for headaches. Negative for dizziness, focal weakness and seizures.  Psychiatric/Behavioral:  Negative for suicidal ideas. The patient has insomnia.     Past Medical History:  Diagnosis Date   Allergy    Anemia    Arthritis    Breast cancer (HCC)    right breast   Cataracts, bilateral    GERD (gastroesophageal reflux disease)    Glaucoma    Headache    History of anemia    Hyperlipidemia    Hypertension    Seasonal allergies     Past Surgical History:  Procedure Laterality Date   BREAST LUMPECTOMY WITH RADIOACTIVE SEED LOCALIZATION Right 04/11/2017   Procedure: RIGHT BREAST LUMPECTOMY WITH RADIOACTIVE SEED LOCALIZATION X2;  Surgeon: Vernetta Berg, MD;  Location: [facility] OR;  Service: General;  Laterality: Right;    Family History  Problem Relation Age of Onset   Hypertension Father    Colon cancer Brother 20   Esophageal cancer Neg Hx    Rectal cancer Neg Hx    Stomach cancer Neg Hx    Liver cancer Neg Hx    Pancreatic cancer Neg Hx     Social History Reviewed with no changes to be made today.   Outpatient Medications Prior to Visit  Medication Sig Dispense Refill   Propylene Glycol (SYSTANE COMPLETE OP) Apply to eye.     timolol  (TIMOPTIC ) 0.25 % ophthalmic solution Place 1 drop into the left eye every morning. 15 mL 4   amLODipine  (NORVASC ) 5 MG tablet Take 1 tablet (5 mg total) by mouth daily. 90 tablet 0   latanoprost  (XALATAN ) 0.005 % ophthalmic solution Place 1 drop into both eyes every evening. (Patient not taking: Reported on 06/23/2024) 2.5 mL 0   rosuvastatin  (CRESTOR ) 20 MG tablet Take 1 tablet (20 mg total) by mouth every evening. (Patient not taking: Reported on 06/23/2024) 90 tablet 1   albuterol  (PROVENTIL  HFA) 108 (90 Base) MCG/ACT inhaler Inhale 1-2 puffs into the lungs every 6 (six) hours as needed for wheezing or  shortness of breath. (Patient not taking: Reported on 06/23/2024) 6.7 g 0   cetirizine  (ZYRTEC  ALLERGY) 10 MG tablet Take 1 tablet (10 mg total) by mouth daily. (Patient not taking: Reported on 06/23/2024) 30 tablet 11   fluticasone  (FLONASE ) 50 MCG/ACT nasal spray Place 2 sprays into both nostrils daily. (Patient not taking: Reported on 06/23/2024) 16 g 6   latanoprost  (XALATAN ) 0.005 % ophthalmic solution Instill 1 drop into left eye once a day (Patient not taking: Reported on 06/23/2024) 7.5 mL 7   latanoprost  (XALATAN ) 0.005 % ophthalmic solution Place 1 drop into the left eye daily. (Patient not taking: Reported on 06/23/2024) 7.5 mL 7   meclizine  (ANTIVERT ) 25 MG tablet Take 1 tablet (25 mg total) by mouth 2 (two) times daily as needed for dizziness. (Patient not taking: Reported on 06/23/2024) 60 tablet 1   mupirocin  ointment (BACTROBAN ) 2 %  Apply 1 application topically 2 (two) times daily. (Patient not taking: Reported on 06/23/2024) 22 g 0   Olopatadine  HCl 0.2 % SOLN Apply 1 drop into affeced eye(s) daily. (Patient not taking: Reported on 06/23/2024) 2.5 mL 1   omeprazole  (PRILOSEC) 20 MG capsule Take 1 capsule (20 mg total) by mouth daily. (Patient not taking: Reported on 06/23/2024) 30 capsule 3   Vitamin D , Cholecalciferol, 1000 units CAPS Take 1,000 Units by mouth 1 day or 1 dose. (Patient not taking: Reported on 06/23/2024)     Facility-Administered Medications Prior to Visit  Medication Dose Route Frequency Provider Last Rate Last Admin   0.9 %  sodium chloride  infusion  500 mL Intravenous Continuous Pyrtle, Gordy HERO, MD       0.9 %  sodium chloride  infusion  500 mL Intravenous Once Pyrtle, Gordy HERO, MD        Allergies  Allergen Reactions   No Known Allergies    Shrimp [Shellfish Allergy]     Headache, unable to talk       Objective:    BP 115/76 (BP Location: Left Arm, Patient Position: Sitting, Cuff Size: Normal)   Pulse 69   Resp 20   Ht 5' 0.24 (1.53 m)   Wt 186 lb (84.4 kg)    SpO2 98%   BMI 36.04 kg/m  Wt Readings from Last 3 Encounters:  06/23/24 186 lb (84.4 kg)  11/05/22 190 lb (86.2 kg)  10/17/22 195 lb (88.5 kg)    Physical Exam Vitals and nursing note reviewed.  Constitutional:      Appearance: She is well-developed.  HENT:     Head: Normocephalic and atraumatic.  Cardiovascular:     Rate and Rhythm: Normal rate and regular rhythm.     Heart sounds: Normal heart sounds. No murmur heard.    No friction rub. No gallop.  Pulmonary:     Effort: Pulmonary effort is normal. No tachypnea or respiratory distress.     Breath sounds: Normal breath sounds. No decreased breath sounds, wheezing, rhonchi or rales.  Chest:     Chest wall: No tenderness.  Musculoskeletal:        General: Normal range of motion.     Cervical back: Normal range of motion.  Skin:    General: Skin is warm and dry.  Neurological:     Mental Status: She is alert and oriented to person, place, and time.     Coordination: Coordination normal.  Psychiatric:        Behavior: Behavior normal. Behavior is cooperative.        Thought Content: Thought content normal.        Judgment: Judgment normal.          Patient has been counseled extensively about nutrition and exercise as well as the importance of adherence with medications and regular follow-up. The patient was given clear instructions to go to ER or return to medical center if symptoms don't improve, worsen or new problems develop. The patient verbalized understanding.   Follow-up: Return in about 3 weeks (around 07/14/2024) for headaches, insomnia.   Haze LELON Servant, FNP-BC [facility] [place], [place] [phone]   06/23/2024, 5:46 PM

## 2024-06-23 NOTE — Patient Instructions (Addendum)
 [facility] [address], [place], [place] [address] Phone: [phone]   [facility] [phone]    FOR SLEEP  SLEEPY TIME TEA MELATONIN up to 10mg  Ashwaghanda

## 2024-06-24 ENCOUNTER — Telehealth: Payer: Self-pay

## 2024-06-24 ENCOUNTER — Ambulatory Visit: Payer: Self-pay | Admitting: Nurse Practitioner

## 2024-06-24 DIAGNOSIS — E782 Mixed hyperlipidemia: Secondary | ICD-10-CM

## 2024-06-24 DIAGNOSIS — E559 Vitamin D deficiency, unspecified: Secondary | ICD-10-CM

## 2024-06-24 LAB — CMP14+EGFR
ALT: 13 IU/L (ref 0–32)
AST: 17 IU/L (ref 0–40)
Albumin: 4.3 g/dL (ref 3.8–4.8)
Alkaline Phosphatase: 89 IU/L (ref 49–135)
BUN/Creatinine Ratio: 18 (ref 12–28)
BUN: 12 mg/dL (ref 8–27)
Bilirubin Total: 0.3 mg/dL (ref 0.0–1.2)
CO2: 20 mmol/L (ref 20–29)
Calcium: 9.8 mg/dL (ref 8.7–10.3)
Chloride: 104 mmol/L (ref 96–106)
Creatinine, Ser: 0.65 mg/dL (ref 0.57–1.00)
Globulin, Total: 2.7 g/dL (ref 1.5–4.5)
Glucose: 100 mg/dL — ABNORMAL HIGH (ref 70–99)
Potassium: 3.8 mmol/L (ref 3.5–5.2)
Sodium: 140 mmol/L (ref 134–144)
Total Protein: 7 g/dL (ref 6.0–8.5)
eGFR: 94 mL/min/1.73 (ref >59)

## 2024-06-24 LAB — THYROID PANEL WITH TSH
Free Thyroxine Index: 1.8 (ref 1.2–4.9)
T3 Uptake Ratio: 25 % (ref 24–39)
T4, Total: 7.3 ug/dL (ref 4.5–12.0)
TSH: 1.53 u[IU]/mL (ref 0.450–4.500)

## 2024-06-24 LAB — CBC WITH DIFFERENTIAL/PLATELET
Basophils Absolute: 0.1 x10E3/uL (ref 0.0–0.2)
Basos: 2 %
EOS (ABSOLUTE): 0.1 x10E3/uL (ref 0.0–0.4)
Eos: 3 %
Hematocrit: 41.1 % (ref 34.0–46.6)
Hemoglobin: 13.6 g/dL (ref 11.1–15.9)
Immature Grans (Abs): 0 x10E3/uL (ref 0.0–0.1)
Immature Granulocytes: 0 %
Lymphocytes Absolute: 1.9 x10E3/uL (ref 0.7–3.1)
Lymphs: 54 %
MCH: 27.5 pg (ref 26.6–33.0)
MCHC: 33.1 g/dL (ref 31.5–35.7)
MCV: 83 fL (ref 79–97)
Monocytes Absolute: 0.2 x10E3/uL (ref 0.1–0.9)
Monocytes: 5 %
Neutrophils Absolute: 1.3 x10E3/uL — ABNORMAL LOW (ref 1.4–7.0)
Neutrophils: 36 %
Platelets: 263 x10E3/uL (ref 150–450)
RBC: 4.95 x10E6/uL (ref 3.77–5.28)
RDW: 14.5 % (ref 11.7–15.4)
WBC: 3.4 x10E3/uL (ref 3.4–10.8)

## 2024-06-24 LAB — LIPID PANEL
Chol/HDL Ratio: 3.8 ratio (ref 0.0–4.4)
Cholesterol, Total: 280 mg/dL — ABNORMAL HIGH (ref 100–199)
HDL: 74 mg/dL (ref >39)
LDL Chol Calc (NIH): 194 mg/dL — ABNORMAL HIGH (ref 0–99)
Triglycerides: 76 mg/dL (ref 0–149)
VLDL Cholesterol Cal: 12 mg/dL (ref 5–40)

## 2024-06-24 LAB — HEMOGLOBIN A1C
Est. average glucose Bld gHb Est-mCnc: 126 mg/dL
Hgb A1c MFr Bld: 6 % — ABNORMAL HIGH (ref 4.8–5.6)

## 2024-06-24 LAB — VITAMIN D 25 HYDROXY (VIT D DEFICIENCY, FRACTURES): Vit D, 25-Hydroxy: 17.6 ng/mL — ABNORMAL LOW (ref 30.0–100.0)

## 2024-06-24 MED ORDER — VITAMIN D (ERGOCALCIFEROL) 1.25 MG (50000 UNIT) PO CAPS
50000.0000 [IU] | ORAL_CAPSULE | ORAL | 1 refills | Status: DC
  Filled 2024-06-24: qty 12, 84d supply, fill #0

## 2024-06-24 MED ORDER — ROSUVASTATIN CALCIUM 10 MG PO TABS
10.0000 mg | ORAL_TABLET | Freq: Every evening | ORAL | 1 refills | Status: DC
  Filled 2024-06-24: qty 90, 90d supply, fill #0

## 2024-06-24 NOTE — Telephone Encounter (Unsigned)
 Copied from CRM [id number]. Topic: General - Other >> Jun 24, 2024  3:02 PM Dedra B wrote: Reason for CRM: Pt daughter, would like pt mammogram scholarship application sent to [facility]. Pt daughter said she called them today and they had not received it. Pls call pt or pt daughter at [phone].

## 2024-06-25 ENCOUNTER — Ambulatory Visit: Payer: Self-pay

## 2024-06-25 ENCOUNTER — Other Ambulatory Visit: Payer: Self-pay

## 2024-06-25 NOTE — Telephone Encounter (Signed)
 FYI Only or Action Required?: FYI only for provider.  Patient was last seen in primary care on 06/23/2024 by Theotis Haze ORN, NP.  Called Nurse Triage reporting Results.  Symptoms began No triage.  Interventions attempted: Other: No triage.  Symptoms are: No triage.  Triage Disposition: Call PCP When Office is Open  Patient/caregiver understands and will follow disposition?: Yes    Reason for Disposition  Caller requesting routine or non-urgent lab result  Answer Assessment - Initial Assessment Questions 1. REASON FOR CALL or QUESTION: What is your reason for calling today? or How can I best     Review lab results    Daughter Earnie calling to review lab results. This Clinical research associate read message from pcp, daughter verbalized understanding and repeated instruction back to this Clinical research associate. All questions answered, no further needs at this time.  Protocols used: PCP Call - No Triage-[facility]

## 2024-06-25 NOTE — Telephone Encounter (Signed)
 The patients application for the mammogram scholarship has to be reviewed to see if she qualifies first. Someone will reach out the her if she does to schedule appointment. Application has been faxed.

## 2024-06-25 NOTE — Telephone Encounter (Signed)
 Patient needed to go and will call back for her results. Sending to call backs.      Copied from CRM [id number]. Topic: Clinical - Lab/Test Results >> Jun 25, 2024  9:59 AM Jill Adkins wrote: Reason for CRM: missed call about lab results

## 2024-06-25 NOTE — Telephone Encounter (Signed)
 Return call unanswered by patient.

## 2024-06-25 NOTE — Telephone Encounter (Signed)
 Noted

## 2024-07-01 ENCOUNTER — Encounter: Payer: Self-pay | Admitting: Physician Assistant

## 2024-07-01 ENCOUNTER — Ambulatory Visit: Payer: Self-pay | Admitting: Physician Assistant

## 2024-07-01 ENCOUNTER — Other Ambulatory Visit: Payer: Self-pay

## 2024-07-01 VITALS — BP 133/79 | HR 73 | Ht 59.45 in | Wt 183.0 lb

## 2024-07-01 DIAGNOSIS — M79672 Pain in left foot: Secondary | ICD-10-CM

## 2024-07-01 DIAGNOSIS — M25512 Pain in left shoulder: Secondary | ICD-10-CM

## 2024-07-01 MED ORDER — METHYLPREDNISOLONE ACETATE 80 MG/ML IJ SUSP
80.0000 mg | Freq: Once | INTRAMUSCULAR | Status: AC
  Administered 2024-07-01: 80 mg via INTRAMUSCULAR

## 2024-07-01 MED ORDER — KETOROLAC TROMETHAMINE 60 MG/2ML IM SOLN
60.0000 mg | Freq: Once | INTRAMUSCULAR | Status: AC
  Administered 2024-07-01: 60 mg via INTRAMUSCULAR

## 2024-07-01 MED ORDER — IBUPROFEN 600 MG PO TABS
600.0000 mg | ORAL_TABLET | Freq: Three times a day (TID) | ORAL | 0 refills | Status: DC | PRN
  Filled 2024-07-01: qty 30, 10d supply, fill #0

## 2024-07-01 NOTE — Patient Instructions (Addendum)
 Please wait 24 hours prior to taking any Advil , then you can use it every 8 hours as needed.  I started a referral for you to be seen by [facility]opedics [facility] Podiatry, they will reach out to you to schedule.   Douleur  l'paule Shoulder Pain La douleur  l'paule peut avoir diverses origines, notamment : Une lsion de l'paule. Une sollicitation excessive de l'paule. De l'arthrite. La source de Armed forces logistics/support/administrative officer peut tre : Une inflammation. Une lsion de l'articulation de l'paule. Une lsion d'un tendon, d'un ligament ou d'un os. Patti les instructions suivantes  domicile : Suzzette attention [employer] vos symptmes. Informez-en votre prestataire de soins de sant. Suivez ces instructions pour Systems developer. Si vous portez une charpe amovible : Portez l'charpe en suivant les recommandations que votre prestataire vous aura donnes. Retirez-la en respectant  la lettre les instructions que votre prestataire vous aura donnes. Vrifiez l'tat de votre peau autour de l'charpe tous les jours. Signalez tout problme  IT trainer. Desserrez l'charpe si vous ressentez un fourmillement dans vos doigts, s'ils s'engourdissent ou s'ils deviennent froids. Gardez l'charpe propre. Si l'charpe n'est pas tanche : Ne la laissez pas se mouiller. Retirez-la lorsque vous prenez un bain ou une douche. Bougez le bras le moins possible, mais continuez  bouger votre main pour Regulatory affairs officer. Prise en charge de [employer], de la rigidit et de l'enflure  Appliquez de la glace sur la zone douloureuse si on vous l'a recommand. S'il est possible d'enlever l'charpe ou le dispositif d'immobilisation, vous devez l'ter en suivant les instructions que votre prestataire vous aura donnes. Mettez de la [employer] un sac en plastique. Placez une serviette entre votre peau et le sac. Laissez la glace en place pendant 20 minutes, 2  3 fois par jour. Si votre peau devient rouge vif, retirez  la glace immdiatement pour viter toute lsion au niveau de la peau. Vous risqueriez d'endommager votre peau plus facilement si vous ne pouvez pas ressentir [employer], Special educational needs teacher froid. Bougez vos doigts rgulirement pour rduire la raideur et l'enflure. Serrez le plus souvent possible une balle souple ou un coussin en mousse. Cela permettra d'viter que votre paule n'enfle. Cela permettra galement de renforcer votre bras. Instructions gnrales Prenez vos mdicaments en vente libre ou dlivrs sur ordonnance, conformment aux recommandations de IT trainer. Faire de l'exercice Scientist, product/process development. Faites de l'exercice si votre prestataire vous l'a recommand. Vous serez peut-tre orient(e) vers un kinsithrapeute afin que celui-ci vous aide  vous rtablir au moyen d'exercices de rducation. Rendez-vous  toutes vos visites de suivi afin d'viter qu'une invalidit permanente de l'paule ou des problmes lis  une douleur chronique ne se dveloppent. Prenez contact avec un prestataire de soins de sant si : La douleur n'est pas soulage par les mdicaments. Une douleur nouvelle apparait dans votre bras, votre main ou vos doigts. Lorsque vous desserrez l'charpe, vous continuez  ressentir des [employer] ou un engourdissement dans votre bras, votre main ou vos doigts, ou si l'enflure ou la douleur persistent. Demandez immdiatement de l'aide si : Votre bras, votre main ou vos doigts sont froids ou deviennent bleus. Ces conseils et renseignements ne sauraient se substituer  l'avis mdical de votre prestataire de soins de sant. Par consquent, il est primordial de parler de toutes vos proccupations avec votre prestataire de soins de sant. Document Revised: 05/17/2022 Document Reviewed: 05/17/2022 Elsevier Patient Education  2024 [employer].

## 2024-07-01 NOTE — Progress Notes (Unsigned)
 Established Patient Office Visit  Subjective   Patient ID: Jill Adkins, female    DOB: 1951/02/28  Age: 73 y.o. MRN: [id number]  Chief Complaint  Patient presents with   Shoulder Pain    Patient c/o left shoulder pain. Pain has been consistent for 3+ months. Pain initially felt with activity, but now is consistent throughout the day.    Foot Pain    Patient also c/o left heel pain. Pain has been consistent for 3+ months. Pain initially felt with activity, but now is consistent throughout the day.    Discussed the use of AI scribe software for clinical note transcription with the patient, who gave verbal consent to proceed.  History of Present Illness  Due to language barrier, an interpreter was present during the history-taking and subsequent discussion (and for part of the physical exam) with this patient.      Past Medical History:  Diagnosis Date   Allergy    Anemia    Arthritis    Breast cancer (HCC)    right breast   Cataracts, bilateral    GERD (gastroesophageal reflux disease)    Glaucoma    Headache    History of anemia    Hyperlipidemia    Hypertension    Seasonal allergies    Social History   Socioeconomic History   Marital status: Widowed    Spouse name: Not on file   Number of children: Not on file   Years of education: Not on file   Highest education level: Not on file  Occupational History   Not on file  Tobacco Use   Smoking status: Never   Smokeless tobacco: Never  Vaping Use   Vaping status: Never Used  Substance and Sexual Activity   Alcohol use: No   Drug use: No   Sexual activity: Not Currently    Partners: Male  Other Topics Concern   Not on file  Social History Narrative   Not on file   Social Drivers of Health   Financial Resource Strain: Not on file  Food Insecurity: Not on file  Transportation Needs: Not on file  Physical Activity: Not on file  Stress: Not on file  Social Connections: Not on file  Intimate Partner  Violence: Not on file   Family History  Problem Relation Age of Onset   Hypertension Father    Colon cancer Brother 39   Esophageal cancer Neg Hx    Rectal cancer Neg Hx    Stomach cancer Neg Hx    Liver cancer Neg Hx    Pancreatic cancer Neg Hx    Allergies  Allergen Reactions   Shrimp [Shellfish Allergy]     Headache, unable to talk    Review of Systems  Constitutional: Negative.   HENT: Negative.    Eyes: Negative.   Respiratory:  Negative for shortness of breath.   Cardiovascular:  Negative for chest pain.  Gastrointestinal: Negative.   Genitourinary: Negative.   Musculoskeletal:  Positive for joint pain and myalgias.  Skin: Negative.   Neurological: Negative.   Endo/Heme/Allergies: Negative.   Psychiatric/Behavioral: Negative.        Objective:     There were no vitals taken for this visit. BP Readings from Last 3 Encounters:  06/23/24 115/76  11/05/22 137/80  10/17/22 129/82   Wt Readings from Last 3 Encounters:  06/23/24 186 lb (84.4 kg)  11/05/22 190 lb (86.2 kg)  10/17/22 195 lb (88.5 kg)    Physical Exam Vitals  and nursing note reviewed.  Constitutional:      Appearance: Normal appearance.  HENT:     Head: Normocephalic and atraumatic.     Right Ear: External ear normal.     Left Ear: External ear normal.  Cardiovascular:     Rate and Rhythm: Normal rate and regular rhythm.     Pulses: Normal pulses.          Dorsalis pedis pulses are 2+ on the left side.       Posterior tibial pulses are 2+ on the left side.     Heart sounds: Normal heart sounds.  Musculoskeletal:     Left shoulder: Bony tenderness present. No swelling. Decreased range of motion.     Cervical back: Normal range of motion and neck supple.     Left foot: No swelling.  Feet:     Left foot:     Skin integrity: No erythema or dry skin.  Neurological:     Mental Status: She is alert.         Assessment & Plan:   Problem List Items Addressed This Visit    None   No follow-ups on file.    Kirk RAMAN Mayers, PA-C

## 2024-07-02 ENCOUNTER — Other Ambulatory Visit: Payer: Self-pay

## 2024-07-02 ENCOUNTER — Inpatient Hospital Stay
Admission: RE | Admit: 2024-07-02 | Discharge: 2024-07-02 | Discharge status: Home / Self Care | Payer: Self-pay | Source: Ambulatory Visit | Attending: Nurse Practitioner

## 2024-07-02 DIAGNOSIS — Z1231 Encounter for screening mammogram for malignant neoplasm of breast: Secondary | ICD-10-CM

## 2024-07-03 ENCOUNTER — Ambulatory Visit (INDEPENDENT_AMBULATORY_CARE_PROVIDER_SITE_OTHER)

## 2024-07-03 ENCOUNTER — Ambulatory Visit (HOSPITAL_BASED_OUTPATIENT_CLINIC_OR_DEPARTMENT_OTHER): Payer: Self-pay | Admitting: Student

## 2024-07-03 ENCOUNTER — Other Ambulatory Visit (HOSPITAL_BASED_OUTPATIENT_CLINIC_OR_DEPARTMENT_OTHER): Payer: Self-pay

## 2024-07-03 DIAGNOSIS — M25512 Pain in left shoulder: Secondary | ICD-10-CM

## 2024-07-03 DIAGNOSIS — G8929 Other chronic pain: Secondary | ICD-10-CM

## 2024-07-03 MED ORDER — METHOCARBAMOL 500 MG PO TABS
500.0000 mg | ORAL_TABLET | Freq: Four times a day (QID) | ORAL | 0 refills | Status: AC
  Filled 2024-07-03 (×2): qty 20, 5d supply, fill #0
  Filled 2024-07-06: qty 20, 5d supply, fill #1

## 2024-07-03 NOTE — Progress Notes (Signed)
 Chief Complaint: Left shoulder pain     History of Present Illness:    Jill Adkins is a 73 y.o. female who presents to clinic today for evaluation of left shoulder pain.  She has been experiencing left shoulder pain that began over 7 months ago, and this recently began radiating up toward the neck.  She was seen by primary care 2 days ago and received Depo-Medrol  and Toradol  injections, which only provided about 24 hours of relief.  She denies any history of injury to the shoulder.  Pain is mainly around the posterior shoulder up toward the left side of the neck.  She also uses ibuprofen  occasionally.  Denies any numbness or tingling.   Surgical History:   None  PMH/PSH/Family History/Social History/Meds/Allergies:    Past Medical History:  Diagnosis Date   Allergy    Anemia    Arthritis    Breast cancer (HCC)    right breast   Cataracts, bilateral    GERD (gastroesophageal reflux disease)    Glaucoma    Headache    History of anemia    Hyperlipidemia    Hypertension    Seasonal allergies    Past Surgical History:  Procedure Laterality Date   BREAST BIOPSY Left    BREAST LUMPECTOMY Right 2018   BREAST LUMPECTOMY WITH RADIOACTIVE SEED LOCALIZATION Right 04/11/2017   Procedure: RIGHT BREAST LUMPECTOMY WITH RADIOACTIVE SEED LOCALIZATION X2;  Surgeon: Vernetta Berg, MD;  Location: [facility] OR;  Service: General;  Laterality: Right;   Social History   Socioeconomic History   Marital status: Widowed    Spouse name: Not on file   Number of children: Not on file   Years of education: Not on file   Highest education level: Not on file  Occupational History   Not on file  Tobacco Use   Smoking status: Never   Smokeless tobacco: Never  Vaping Use   Vaping status: Never Used  Substance and Sexual Activity   Alcohol use: No   Drug use: No   Sexual activity: Not Currently    Partners: Male  Other Topics Concern   Not on file  Social  History Narrative   Not on file   Social Drivers of Health   Financial Resource Strain: Not on file  Food Insecurity: Not on file  Transportation Needs: Not on file  Physical Activity: Not on file  Stress: Not on file  Social Connections: Not on file   Family History  Problem Relation Age of Onset   Hypertension Father    Colon cancer Brother 102   Esophageal cancer Neg Hx    Rectal cancer Neg Hx    Stomach cancer Neg Hx    Liver cancer Neg Hx    Pancreatic cancer Neg Hx    Allergies  Allergen Reactions   Shrimp [Shellfish Allergy]     Headache, unable to talk   Current Outpatient Medications  Medication Sig Dispense Refill   methocarbamol  (ROBAXIN ) 500 MG tablet Take 1 tablet (500 mg total) by mouth 4 (four) times daily for 10 days. 40 tablet 0   albuterol  (PROVENTIL  HFA) 108 (90 Base) MCG/ACT inhaler Inhale 1-2 puffs into the lungs every 6 (six) hours as needed for wheezing or shortness of breath. 6.7 g 0   amLODipine  (NORVASC ) 5 MG tablet Take  1 tablet (5 mg total) by mouth daily. For blood pressure 90 tablet 0   cetirizine  (ZYRTEC  ALLERGY) 10 MG tablet Take 1 tablet (10 mg total) by mouth daily. For allergies 30 tablet 11   fluticasone  (FLONASE ) 50 MCG/ACT nasal spray Place 2 sprays into both nostrils daily. For allergies 16 g 6   ibuprofen  (ADVIL ) 600 MG tablet Take 1 tablet (600 mg total) by mouth every 8 (eight) hours as needed. 30 tablet 0   latanoprost  (XALATAN ) 0.005 % ophthalmic solution Place 1 drop into both eyes every evening. 2.5 mL 0   omeprazole  (PRILOSEC) 20 MG capsule Take 1 capsule (20 mg total) by mouth daily. For acid reflux (Patient not taking: Reported on 07/01/2024) 30 capsule 3   Propylene Glycol (SYSTANE COMPLETE OP) Apply to eye.     rosuvastatin  (CRESTOR ) 10 MG tablet Take 1 tablet (10 mg total) by mouth every evening. For cholesterol 90 tablet 1   timolol  (TIMOPTIC ) 0.25 % ophthalmic solution Place 1 drop into the left eye every morning. 15 mL 4    topiramate  (TOPAMAX ) 25 MG capsule Take 1 capsule (25 mg total) by mouth daily. FOR HEADACHES 30 capsule 1   Vitamin D , Ergocalciferol , (DRISDOL ) 1.25 MG (50000 UNIT) CAPS capsule Take 1 capsule (50,000 Units total) by mouth every 7 (seven) days. 12 capsule 1   Current Facility-Administered Medications  Medication Dose Route Frequency Provider Last Rate Last Admin   0.9 %  sodium chloride  infusion  500 mL Intravenous Continuous Pyrtle, Gordy HERO, MD       0.9 %  sodium chloride  infusion  500 mL Intravenous Once Pyrtle, Gordy HERO, MD       No results found.  Review of Systems:   A ROS was performed including pertinent positives and negatives as documented in the HPI.  Physical Exam :   Constitutional: NAD and appears stated age Neurological: Alert and oriented Psych: Appropriate affect and cooperative There were no vitals taken for this visit.   Comprehensive Musculoskeletal Exam:    Examination of the left shoulder demonstrates no significant anterior glenohumeral joint or lateral deltoid tenderness.  There is tenderness in the posterior shoulder, spanning the distribution of the upper trapezius into the left side of the cervical spine as well as over the scapular area.  Cervical range of motion slightly limited in rotation to the left.  Grip strength 5/5.  Imaging:   Xray (left shoulder 3 views): Mild glenohumeral degenerative changes with small osteophyte formation but otherwise negative for bony abnormality   I personally reviewed and interpreted the radiographs.   Assessment:   73 y.o. female with over 5-month history of worsening left shoulder pain.  This has recently began rating up into the left side of the cervical spine.  X-rays taken and reviewed show very mild degenerative changes in the glenohumeral joint which I do not believe is the cause of her current symptoms.  Exam today appears more consistent with myofascial pain as opposed to cervical radiculopathy, given areas of  tenderness in the neck and upper back without radicular symptoms.  Continue ibuprofen  for pain management and will add prescription for Robaxin  to use as needed.  Advise heat therapy using heating pad.  I do think she would benefit from physical therapy however current schedule may not allow this and patient will be self-pay, so we will have patient notify our office if she would like a referral sent.  Plan :    - Start Robaxin  500 mg as  needed and consider referral to physical therapy if symptoms persist     I personally saw and evaluated the patient, and participated in the management and treatment plan.  Leonce Reveal, PA-C Orthopedics

## 2024-07-06 ENCOUNTER — Other Ambulatory Visit (HOSPITAL_BASED_OUTPATIENT_CLINIC_OR_DEPARTMENT_OTHER): Payer: Self-pay

## 2024-07-06 ENCOUNTER — Other Ambulatory Visit: Payer: Self-pay

## 2024-07-08 ENCOUNTER — Other Ambulatory Visit: Payer: Self-pay

## 2024-07-08 ENCOUNTER — Encounter: Payer: Self-pay | Admitting: Podiatry

## 2024-07-08 ENCOUNTER — Ambulatory Visit: Payer: Self-pay | Admitting: Physician Assistant

## 2024-07-08 ENCOUNTER — Ambulatory Visit (INDEPENDENT_AMBULATORY_CARE_PROVIDER_SITE_OTHER): Payer: Self-pay | Admitting: Podiatry

## 2024-07-08 ENCOUNTER — Ambulatory Visit (INDEPENDENT_AMBULATORY_CARE_PROVIDER_SITE_OTHER): Payer: Self-pay

## 2024-07-08 DIAGNOSIS — M722 Plantar fascial fibromatosis: Secondary | ICD-10-CM

## 2024-07-08 DIAGNOSIS — M76822 Posterior tibial tendinitis, left leg: Secondary | ICD-10-CM

## 2024-07-08 DIAGNOSIS — M7672 Peroneal tendinitis, left leg: Secondary | ICD-10-CM

## 2024-07-08 MED ORDER — IBUPROFEN 600 MG PO TABS
600.0000 mg | ORAL_TABLET | Freq: Three times a day (TID) | ORAL | 0 refills | Status: AC | PRN
Start: 2024-07-08 — End: 2024-08-08
  Filled 2024-07-08 – 2024-07-09 (×2): qty 90, 30d supply, fill #0

## 2024-07-08 NOTE — Patient Instructions (Signed)
 Peroneal Tendinopathy Rehab Ask your health care provider which exercises are safe for you. Do exercises exactly as told by your health care provider and adjust them as directed. It is normal to feel mild stretching, pulling, tightness, or discomfort as you do these exercises. Stop right away if you feel sudden pain or your pain gets worse. Do not begin these exercises until told by your health care provider. Stretching and range-of-motion exercises These exercises warm up your muscles and joints. They can help improve the movement and flexibility of your ankle. They may also help to relieve pain and stiffness. Gastrocnemius and soleus stretch, standing This is an exercise in which you stand on a step and use your body weight to stretch your calf muscles. To do this exercise: Stand on the edge of a step on the ball of your left / right foot. The ball of your foot is on the walking surface, right under your toes. Keep your other foot firmly on the same step. Hold on to the wall, a railing, or a chair for balance. Slowly lift your other foot, allowing your body weight to press your left / right heel down over the edge of the step. You should feel a stretch in your left / right calf (gastrocnemius and soleus). Hold this position for __________ seconds. Return both feet to the step. Repeat this exercise with a slight bend in your left / right knee. Repeat __________ times with your left / right knee straight and __________ times with your left / right knee bent. Complete this exercise __________ times a day. Strengthening exercises These exercises build strength and endurance in your foot and ankle. Endurance is the ability to use your muscles for a long time, even after they get tired. Ankle dorsiflexion with band  Secure a rubber exercise band or tube to an object, such as a table leg, that will not move when the band is pulled. Secure the other end of the band around your left / right foot. Sit on  the floor. Face the object with your left / right leg extended. The band or tube should be slightly tense when your foot is relaxed. Slowly flex your left / right ankle and toes to bring your foot toward you (dorsiflexion). Hold this position for __________ seconds. Let the band or tube slowly pull your foot back to the starting position. Repeat __________ times. Complete this exercise __________ times a day. Ankle eversion  Sit on the floor with your legs straight out in front of you. Loop a rubber exercise band or tube around the ball of your left / right foot. The ball of your foot is on the walking surface, right under your toes. Hold the ends of the band in your hands. You can also secure the band to a stable object. The band or tube should be slightly tense when your foot is relaxed. Slowly push your foot outward, away from your other leg (eversion). Hold this position for __________ seconds. Slowly return your foot to the starting position. Repeat __________ times. Complete this exercise __________ times a day. Plantar flexion, standing This exercise is sometimes called a standing heel raise. Stand with your feet shoulder-width apart. Place your hands on a wall or table to steady yourself as needed. Try not to use it for support. Keep your weight spread evenly over the width of your feet while you slowly rise up on your toes (plantar flexion). If told by your health care provider: Shift your weight  toward your left / right leg until you feel challenged. Stand on your left / right leg only. Hold this position for __________ seconds. Repeat __________ times. Complete this exercise __________ times a day. Single leg stand  Without shoes, stand near a railing or in a doorway. You may hold on to the railing or doorframe as needed. Stand on your left / right foot. Keep your big toe down on the floor and try to keep your arch lifted. Do not roll to the outside of your foot. If this  exercise is too easy, you can try it with your eyes closed or while standing on a pillow. Hold this position for __________ seconds. Repeat __________ times. Complete this exercise __________ times a day. This information is not intended to replace advice given to you by your health care provider. Make sure you discuss any questions you have with your health care provider. Document Revised: 01/18/2022 Document Reviewed: 01/18/2022 Elsevier Patient Education  2024 [employer].

## 2024-07-08 NOTE — Progress Notes (Signed)
  Subjective:  Patient ID: Jill Adkins, female    DOB: [birth year],   MRN: [id number]  Chief Complaint  Patient presents with   Foot Pain    She has pain in her left heel.    73 y.o. female presents for concern of pain in her left heel that has been present for 4 months. Denies any injury. Here today with family member who is translating. Relates pain at the end of the day after being on her feet. Denies pain with first steps in the morning. She denies any treatments.  Denies any other pedal complaints. Denies n/v/f/c.   Past Medical History:  Diagnosis Date   Allergy    Anemia    Arthritis    Breast cancer (HCC)    right breast   Cataracts, bilateral    GERD (gastroesophageal reflux disease)    Glaucoma    Headache    History of anemia    Hyperlipidemia    Hypertension    Seasonal allergies     Objective:  Physical Exam: Vascular: DP/PT pulses 2/4 bilateral. CFT <3 seconds. Normal hair growth on digits. No edema.  Skin. No lacerations or abrasions bilateral feet.  Musculoskeletal: MMT 5/5 bilateral lower extremities in DF, PF, Inversion and Eversion. Deceased ROM in DF of ankle joint. No tenderness to medial calcaneal tubercle of achilles. Tender to peroneal tendon distal to lateral malleolus and some pain to PT tend around insertion site. Some pain wit eversion and inversion. No pain with DF or PF. Difficult to assess given patient confusion.  Neurological: Sensation intact to light touch.   Assessment:   1. Peroneal tendonitis, left   2. Posterior tibial tendon dysfunction (PTTD) of left lower extremity      Plan:  Patient was evaluated and treated and all questions answered. Discussed PTTD and peroneal tendonitis with patient.  X-rays reviewed and discussed with patient. No acute fractures or dislocations noted. Mild spurring noted at inferior calcaneus. 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  Discussed treatment options including, ice, NSAIDS, supportive  shoes, bracing, and stretching. Stretching exercises provided to be done on a daily basis.   Refill of ibuprofen  600 mg sent to pharmacy. Previous CMP reviewed and wnl for kidney function labs.  Ankle brace dispensed.  Follow-up 6 weeks or sooner if any problems arise. In the meantime, encouraged to call the office with any questions, concerns, change in symptoms.     Asberry Failing, DPM

## 2024-07-09 ENCOUNTER — Other Ambulatory Visit: Payer: Self-pay

## 2024-07-10 ENCOUNTER — Ambulatory Visit: Payer: Self-pay

## 2024-07-14 ENCOUNTER — Encounter: Payer: Self-pay | Admitting: Nurse Practitioner

## 2024-07-14 ENCOUNTER — Ambulatory Visit: Payer: Self-pay | Attending: Nurse Practitioner | Admitting: Nurse Practitioner

## 2024-07-14 ENCOUNTER — Other Ambulatory Visit: Payer: Self-pay

## 2024-07-14 VITALS — BP 121/80 | HR 83 | Resp 19 | Ht 59.45 in | Wt 187.2 lb

## 2024-07-14 DIAGNOSIS — K219 Gastro-esophageal reflux disease without esophagitis: Secondary | ICD-10-CM

## 2024-07-14 DIAGNOSIS — F5101 Primary insomnia: Secondary | ICD-10-CM

## 2024-07-14 DIAGNOSIS — I10 Essential (primary) hypertension: Secondary | ICD-10-CM

## 2024-07-14 DIAGNOSIS — J302 Other seasonal allergic rhinitis: Secondary | ICD-10-CM

## 2024-07-14 DIAGNOSIS — E782 Mixed hyperlipidemia: Secondary | ICD-10-CM

## 2024-07-14 DIAGNOSIS — R519 Headache, unspecified: Secondary | ICD-10-CM

## 2024-07-14 DIAGNOSIS — Z79899 Other long term (current) drug therapy: Secondary | ICD-10-CM

## 2024-07-14 DIAGNOSIS — E559 Vitamin D deficiency, unspecified: Secondary | ICD-10-CM

## 2024-07-14 MED ORDER — TRAZODONE HCL 50 MG PO TABS
25.0000 mg | ORAL_TABLET | Freq: Every evening | ORAL | 0 refills | Status: AC | PRN
  Filled 2024-07-14 – 2024-07-15 (×2): qty 90, 90d supply, fill #0

## 2024-07-14 MED ORDER — ROSUVASTATIN CALCIUM 10 MG PO TABS
10.0000 mg | ORAL_TABLET | Freq: Every evening | ORAL | 0 refills | Status: AC
  Filled 2024-07-14 (×2): qty 180, 180d supply, fill #0

## 2024-07-14 MED ORDER — OMEPRAZOLE 20 MG PO CPDR
20.0000 mg | DELAYED_RELEASE_CAPSULE | Freq: Every day | ORAL | 0 refills | Status: AC
  Filled 2024-07-14 (×2): qty 180, 180d supply, fill #0

## 2024-07-14 MED ORDER — TOPIRAMATE 25 MG PO CPSP
25.0000 mg | ORAL_CAPSULE | Freq: Every evening | ORAL | 0 refills | Status: AC
  Filled 2024-07-14: qty 30, 30d supply, fill #0
  Filled 2024-07-14 – 2024-07-21 (×2): qty 180, 180d supply, fill #0

## 2024-07-14 MED ORDER — CETIRIZINE HCL 10 MG PO TABS
10.0000 mg | ORAL_TABLET | Freq: Every day | ORAL | 0 refills | Status: AC
  Filled 2024-07-14 (×2): qty 180, 180d supply, fill #0

## 2024-07-14 MED ORDER — VITAMIN D (ERGOCALCIFEROL) 1.25 MG (50000 UNIT) PO CAPS
50000.0000 [IU] | ORAL_CAPSULE | ORAL | 1 refills | Status: AC
  Filled 2024-07-14: qty 12, 84d supply, fill #0

## 2024-07-14 MED ORDER — AMLODIPINE BESYLATE 5 MG PO TABS
5.0000 mg | ORAL_TABLET | Freq: Every day | ORAL | 0 refills | Status: AC
  Filled 2024-07-14 (×2): qty 180, 180d supply, fill #0

## 2024-07-14 NOTE — Progress Notes (Signed)
 Requesting a years worth of medication due to travel.

## 2024-07-14 NOTE — Progress Notes (Signed)
 Assessment & Plan:  [name] was seen today for headache and insomnia.  Diagnoses and all orders for this visit:  Frequent headaches -     topiramate  (TOPAMAX ) 25 MG capsule; Take 1 capsule (25 mg total) by mouth at bedtime. FOR HEADACHES  Primary insomnia -     traZODone (DESYREL) 50 MG tablet; Take 0.5-1 tablets (25-50 mg total) by mouth at bedtime as needed for sleep. FOR INSOMNIA Intermittent headaches with ear pain possibly linked to medication. Insomnia managed with Topamax . - Advise taking headache medication at night to assess effect on ear pain. - Counseled against nighttime ibuprofen  Adkins to gastric risks; recommend taking with dinner.   Primary hypertension Blood pressure is well controlled.  -     amLODipine  (NORVASC ) 5 MG tablet; Take 1 tablet (5 mg total) by mouth daily. For blood pressure  Seasonal allergies -     cetirizine  (ZYRTEC  ALLERGY) 10 MG tablet; Take 1 tablet (10 mg total) by mouth daily. For allergies  GERD without esophagitis -     omeprazole  (PRILOSEC) 20 MG capsule; Take 1 capsule (20 mg total) by mouth daily. For acid reflux INSTRUCTIONS: Avoid GERD Triggers: acidic, spicy or fried foods, caffeine, coffee, sodas,  alcohol and chocolate.    Mixed hyperlipidemia -     rosuvastatin  (CRESTOR ) 10 MG tablet; Take 1 tablet (10 mg total) by mouth every evening. For cholesterol INSTRUCTIONS: Work on a low fat, heart healthy diet and participate in regular aerobic exercise program by working out at least 150 minutes per week; 5 days a week-30 minutes per day. Avoid red meat/beef/steak,  fried foods. junk foods, sodas, sugary drinks, unhealthy snacking, alcohol and smoking.  Drink at least 80 oz of water per day and monitor your carbohydrate intake daily.    Vitamin D  deficiency disease -     Vitamin D , Ergocalciferol , (DRISDOL ) 1.25 MG (50000 UNIT) CAPS capsule; Take 1 capsule (50,000 Units total) by mouth every 7 (seven) days. VITAMIN D  DEFICIENCY    Patient has  been counseled on age-appropriate routine health concerns for screening and prevention. These are reviewed and up-to-date. Referrals have been placed accordingly. Immunizations are up-to-date or declined.    Subjective:   Chief Complaint  Patient presents with   Headache   Insomnia    History of Present Illness Jill Adkins is a 73 year old female who presents for follow-up of headaches and insomnia.   She is accompanied by her daughter today who is translating for her.   I saw her a few weeks ago and at that time she was experiencing persistent headaches that have been present for over 1 year and primarily occurring several times a week at night and affecting her ability to sleep.  She has been using Advil  at home for relief and headaches were worse when she is lying down.She has a history of chronic headaches and underwent an MRI in the past, which did not reveal any significant findings. She reports that she went to the eye doctor recently and was told she has glaucoma and cataracts   I started her on low-dose Topamax  at that time.  Today she reports some improvement of headaches, although she notes ear pain when taking it daily. She is uncertain if the ear pain is related to this medication Adkins to her use of multiple medications taken at the same time. She does however take Topamax  daily.  She takes ibuprofen  1 to 2 tablets a day and only as needed for her  shoulder pain, but only has a 30-day supply based on her current prescription.  She is planning to travel to [place] for a year and is seeking a six-month supply of her medications. She has been prescribed amlodipine  for blood pressure and has recently seen an eye doctor for glaucoma, who prescribed a new eye drop.   HPI  Review of Systems  Constitutional:  Negative for fever, malaise/fatigue and weight loss.  HENT: Negative.  Negative for nosebleeds.   Eyes: Negative.  Negative for blurred vision, double vision and photophobia.   Respiratory: Negative.  Negative for cough and shortness of breath.   Cardiovascular: Negative.  Negative for chest pain, palpitations and leg swelling.  Gastrointestinal: Negative.  Negative for heartburn, nausea and vomiting.  Musculoskeletal:  Positive for joint pain. Negative for myalgias.  Neurological:  Positive for headaches. Negative for dizziness, focal weakness and seizures.  Psychiatric/Behavioral:  Negative for suicidal ideas. The patient has insomnia.     Past Medical History:  Diagnosis Date   Allergy    Anemia    Arthritis    Breast cancer (HCC)    right breast   Cataracts, bilateral    GERD (gastroesophageal reflux disease)    Glaucoma    Headache    History of anemia    Hyperlipidemia    Hypertension    Seasonal allergies     Past Surgical History:  Procedure Laterality Date   BREAST BIOPSY Left    BREAST LUMPECTOMY Right 2018   BREAST LUMPECTOMY WITH RADIOACTIVE SEED LOCALIZATION Right 04/11/2017   Procedure: RIGHT BREAST LUMPECTOMY WITH RADIOACTIVE SEED LOCALIZATION X2;  Surgeon: Vernetta Berg, MD;  Location: [facility] OR;  Service: General;  Laterality: Right;    Family History  Problem Relation Age of Onset   Hypertension Father    Colon cancer Brother 56   Esophageal cancer Neg Hx    Rectal cancer Neg Hx    Stomach cancer Neg Hx    Liver cancer Neg Hx    Pancreatic cancer Neg Hx     Social History Reviewed with no changes to be made today.   Outpatient Medications Prior to Visit  Medication Sig Dispense Refill   albuterol  (PROVENTIL  HFA) 108 (90 Base) MCG/ACT inhaler Inhale 1-2 puffs into the lungs every 6 (six) hours as needed for wheezing or shortness of breath. 6.7 g 0   fluticasone  (FLONASE ) 50 MCG/ACT nasal spray Place 2 sprays into both nostrils daily. For allergies 16 g 6   ibuprofen  (ADVIL ) 600 MG tablet Take 1 tablet (600 mg total) by mouth every 8 (eight) hours as needed. 90 tablet 0   latanoprost  (XALATAN ) 0.005 % ophthalmic solution  Place 1 drop into both eyes every evening. 2.5 mL 0   Propylene Glycol (SYSTANE COMPLETE OP) Apply to eye.     timolol  (TIMOPTIC ) 0.25 % ophthalmic solution Place 1 drop into the left eye every morning. 15 mL 4   amLODipine  (NORVASC ) 5 MG tablet Take 1 tablet (5 mg total) by mouth daily. For blood pressure 90 tablet 0   cetirizine  (ZYRTEC  ALLERGY) 10 MG tablet Take 1 tablet (10 mg total) by mouth daily. For allergies 30 tablet 11   omeprazole  (PRILOSEC) 20 MG capsule Take 1 capsule (20 mg total) by mouth daily. For acid reflux 30 capsule 3   rosuvastatin  (CRESTOR ) 10 MG tablet Take 1 tablet (10 mg total) by mouth every evening. For cholesterol 90 tablet 1   topiramate  (TOPAMAX ) 25 MG capsule Take 1 capsule (25 mg  total) by mouth daily. FOR HEADACHES 30 capsule 1   Vitamin D , Ergocalciferol , (DRISDOL ) 1.25 MG (50000 UNIT) CAPS capsule Take 1 capsule (50,000 Units total) by mouth every 7 (seven) days. 12 capsule 1   0.9 %  sodium chloride  infusion      0.9 %  sodium chloride  infusion      No facility-administered medications prior to visit.    Allergies  Allergen Reactions   Shrimp [Shellfish Allergy]     Headache, unable to talk       Objective:    BP 121/80 (BP Location: Left Arm, Patient Position: Sitting, Cuff Size: Normal)   Pulse 83   Resp 19   Ht 4' 11.45 (1.51 m)   Wt 187 lb 3.2 oz (84.9 kg)   SpO2 100%   BMI 37.24 kg/m  Wt Readings from Last 3 Encounters:  07/14/24 187 lb 3.2 oz (84.9 kg)  07/01/24 183 lb (83 kg)  06/23/24 186 lb (84.4 kg)    Physical Exam Vitals and nursing note reviewed.  Constitutional:      Appearance: She is well-developed.  HENT:     Head: Normocephalic and atraumatic.  Cardiovascular:     Rate and Rhythm: Normal rate and regular rhythm.     Heart sounds: Normal heart sounds. No murmur heard.    No friction rub. No gallop.  Pulmonary:     Effort: Pulmonary effort is normal. No tachypnea or respiratory distress.     Breath sounds: Normal  breath sounds. No decreased breath sounds, wheezing, rhonchi or rales.  Chest:     Chest wall: No tenderness.  Abdominal:     General: Bowel sounds are normal.     Palpations: Abdomen is soft.  Musculoskeletal:        General: Normal range of motion.     Cervical back: Normal range of motion.  Skin:    General: Skin is warm and dry.  Neurological:     Mental Status: She is alert and oriented to person, place, and time.     Coordination: Coordination normal.  Psychiatric:        Behavior: Behavior normal. Behavior is cooperative.        Thought Content: Thought content normal.        Judgment: Judgment normal.          Patient has been counseled extensively about nutrition and exercise as well as the importance of adherence with medications and regular follow-up. The patient was given clear instructions to go to ER or return to medical center if symptoms don't improve, worsen or new problems develop. The patient verbalized understanding.   Follow-up: No follow-ups on file.   Haze LELON Servant, FNP-BC [facility] [place], [place] [phone]   07/14/2024, 4:49 PM

## 2024-07-15 ENCOUNTER — Other Ambulatory Visit: Payer: Self-pay

## 2024-07-15 ENCOUNTER — Encounter: Payer: Self-pay | Admitting: Nurse Practitioner

## 2024-07-15 ENCOUNTER — Telehealth: Payer: Self-pay | Admitting: Nurse Practitioner

## 2024-07-15 NOTE — Telephone Encounter (Signed)
 Copied from CRM [id number]. Topic: General - Other >> Jul 15, 2024 12:39 PM Suzette B wrote:  Reason for CRM: EARNIE BURNER patient's daughter called from [phone] (M), patient Ms. Sumida is needing a doctor's note to include the date and time of appt for yesterday. Patient is needing this documentation to provide for a flight she missed. I advised Ms. Sow office opens back up at 1:30pm and she could come by and pick up excuse for patient

## 2024-07-15 NOTE — Telephone Encounter (Signed)
 Called patient's daughter, she was informed that the Excuse note is ready for pick up.

## 2024-07-21 ENCOUNTER — Other Ambulatory Visit: Payer: Self-pay

## 2024-07-21 ENCOUNTER — Telehealth: Payer: Self-pay

## 2024-07-21 NOTE — Telephone Encounter (Signed)
 Spoke with patient daughter Earnie. Advised that Vit D is one pill every 7 days and when complete she can go to the OTC vitD daily. Topamax  is on pill at bedtime. Earnie voiced understanding

## 2024-07-21 NOTE — Telephone Encounter (Signed)
 Copied from CRM [id number]. Topic: Clinical - Medical Advice >> Jul 21, 2024 11:06 AM Amber H wrote: Reason for CRM: Patients daughter Earnie calling to ask if patient needed to take the prescribed Vitamin D  everyday or as needed or the over the counter vitamin D . Also needed to know if she had to take the Topamax  everyday or as needed when she has headaches, and Cetrizine everyday or as needed when she has allergies.   [phone]

## 2024-09-14 ENCOUNTER — Telehealth: Payer: Self-pay | Admitting: Nurse Practitioner

## 2024-09-14 NOTE — Telephone Encounter (Signed)
 Copied from CRM #[id number]. Topic: General - Other >> Sep 14, 2024  4:16 PM Amy B wrote:  Reason for CRM: Patient's daughter called stating that she had dropped off an [employer] form to be filled out before Thanksgiving and she would like to know if it has been filled out so she can pick it up.  Please call Earnie at [phone].

## 2024-09-15 NOTE — Telephone Encounter (Signed)
 Return call unanswered. Unable to leave voicemail.

## 2024-10-16 NOTE — Telephone Encounter (Signed)
 Noted, forms are completed and awaiting pick up from front desk.

## 2024-10-16 NOTE — Telephone Encounter (Signed)
 Patient came into the office today asking if the form has been completed. The PCPs CMA stated that the form will be completed by the end of today. Patient acknowledged. Sending this as an [employer].

## 2024-10-22 ENCOUNTER — Encounter: Payer: Self-pay | Admitting: Nurse Practitioner

## 2025-07-05 ENCOUNTER — Encounter: Payer: Self-pay | Admitting: Nurse Practitioner
